# Patient Record
Sex: Female | Born: 1950 | Race: Black or African American | Hispanic: No | Marital: Single | Smoking: Never smoker
Health system: Southern US, Community
[De-identification: ages and names within clinical notes are randomized; demographics above are authoritative.]

## PROBLEM LIST (undated history)

## (undated) DIAGNOSIS — K649 Unspecified hemorrhoids: Secondary | ICD-10-CM

## (undated) DIAGNOSIS — K219 Gastro-esophageal reflux disease without esophagitis: Secondary | ICD-10-CM

## (undated) DIAGNOSIS — I8290 Acute embolism and thrombosis of unspecified vein: Secondary | ICD-10-CM

## (undated) DIAGNOSIS — M1990 Unspecified osteoarthritis, unspecified site: Secondary | ICD-10-CM

## (undated) DIAGNOSIS — I10 Essential (primary) hypertension: Secondary | ICD-10-CM

## (undated) DIAGNOSIS — K279 Peptic ulcer, site unspecified, unspecified as acute or chronic, without hemorrhage or perforation: Secondary | ICD-10-CM

## (undated) DIAGNOSIS — I519 Heart disease, unspecified: Secondary | ICD-10-CM

## (undated) HISTORY — DX: Essential (primary) hypertension: I10

## (undated) HISTORY — PX: OTHER SURGICAL HISTORY: SHX169

## (undated) HISTORY — DX: Unspecified osteoarthritis, unspecified site: M19.90

## (undated) HISTORY — PX: LAPAROSCOPY: SHX197

## (undated) HISTORY — DX: Heart disease, unspecified: I51.9

## (undated) HISTORY — DX: Unspecified hemorrhoids: K64.9

## (undated) HISTORY — DX: Acute embolism and thrombosis of unspecified vein: I82.90

---

## 2004-06-02 ENCOUNTER — Ambulatory Visit: Payer: Self-pay | Admitting: Pain Medicine

## 2004-06-08 ENCOUNTER — Ambulatory Visit: Payer: Self-pay | Admitting: Pain Medicine

## 2004-08-25 ENCOUNTER — Ambulatory Visit: Payer: Self-pay | Admitting: Pain Medicine

## 2004-11-24 ENCOUNTER — Ambulatory Visit: Payer: Self-pay | Admitting: Pain Medicine

## 2005-01-19 ENCOUNTER — Ambulatory Visit: Payer: Self-pay | Admitting: Pain Medicine

## 2005-02-18 ENCOUNTER — Ambulatory Visit: Payer: Self-pay | Admitting: Pain Medicine

## 2005-03-18 ENCOUNTER — Ambulatory Visit: Payer: Self-pay | Admitting: Pain Medicine

## 2005-05-02 ENCOUNTER — Other Ambulatory Visit: Payer: Self-pay

## 2005-05-02 ENCOUNTER — Emergency Department: Payer: Self-pay | Admitting: Emergency Medicine

## 2005-05-04 ENCOUNTER — Ambulatory Visit: Payer: Self-pay | Admitting: Pain Medicine

## 2005-05-27 ENCOUNTER — Ambulatory Visit: Payer: Self-pay | Admitting: Pain Medicine

## 2005-06-11 ENCOUNTER — Emergency Department: Payer: Self-pay | Admitting: Emergency Medicine

## 2005-08-19 ENCOUNTER — Ambulatory Visit: Payer: Self-pay | Admitting: Pain Medicine

## 2005-10-12 ENCOUNTER — Ambulatory Visit: Payer: Self-pay | Admitting: Pain Medicine

## 2006-01-04 ENCOUNTER — Ambulatory Visit: Payer: Self-pay | Admitting: Pain Medicine

## 2006-01-17 ENCOUNTER — Ambulatory Visit: Payer: Self-pay | Admitting: Pain Medicine

## 2006-02-01 ENCOUNTER — Ambulatory Visit: Payer: Self-pay | Admitting: Pain Medicine

## 2006-02-14 ENCOUNTER — Encounter: Payer: Self-pay | Admitting: General Practice

## 2006-03-16 ENCOUNTER — Encounter: Payer: Self-pay | Admitting: General Practice

## 2006-03-29 ENCOUNTER — Ambulatory Visit: Payer: Self-pay | Admitting: Pain Medicine

## 2006-04-16 ENCOUNTER — Encounter: Payer: Self-pay | Admitting: General Practice

## 2006-07-11 ENCOUNTER — Ambulatory Visit: Payer: Self-pay | Admitting: Internal Medicine

## 2007-02-18 ENCOUNTER — Emergency Department: Payer: Self-pay | Admitting: Emergency Medicine

## 2007-06-15 ENCOUNTER — Emergency Department: Payer: Self-pay | Admitting: Internal Medicine

## 2007-09-10 IMAGING — CT CT ABD-PELV W/O CM
1 of 2 series · 16 of 32 positions shown, 20 images · non-contrast
Comparison: none

REASON FOR EXAM: Abdominal pain, stone study
COMMENTS:

[Series 2: stone · axial · 0.90mm/px · z∈[-886,-460]mm · 16 of 156 slices shown, 20 images]
[im 7/156  soft-tissue]
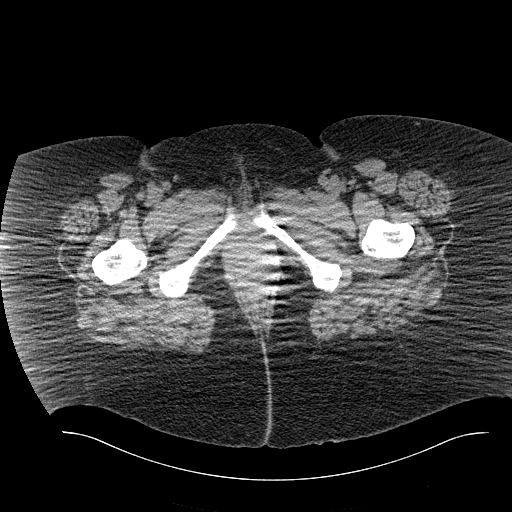
[im 7/156  bone]
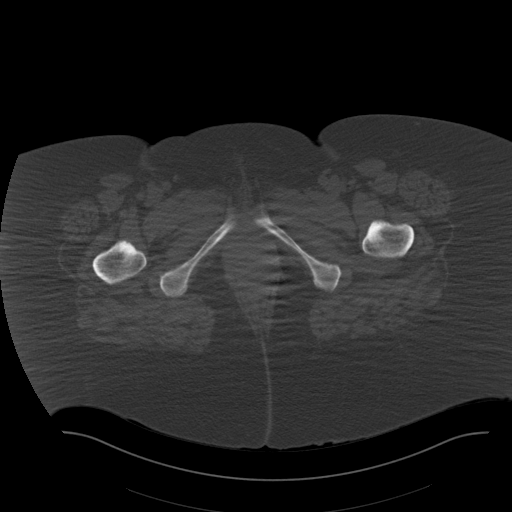
[im 19/156  soft-tissue]
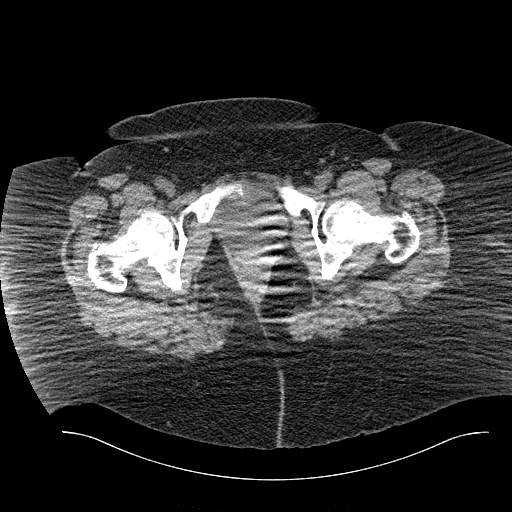
[im 32/156  soft-tissue]
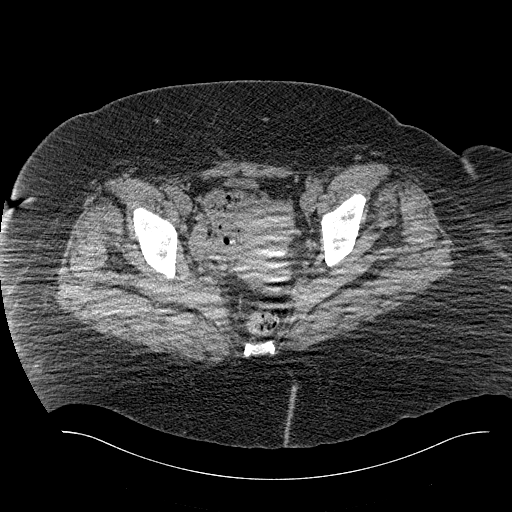
[im 44/156  soft-tissue]
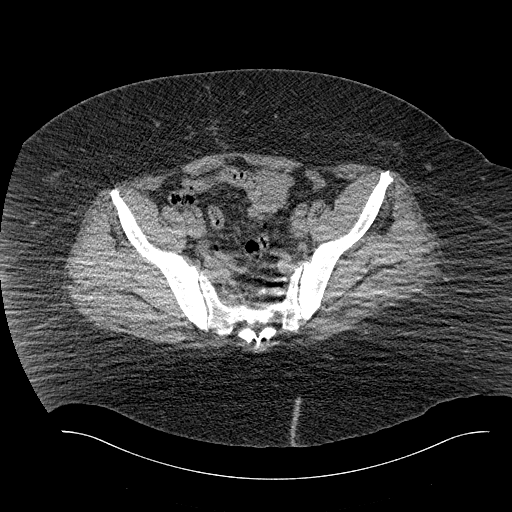
[im 50/156  soft-tissue]
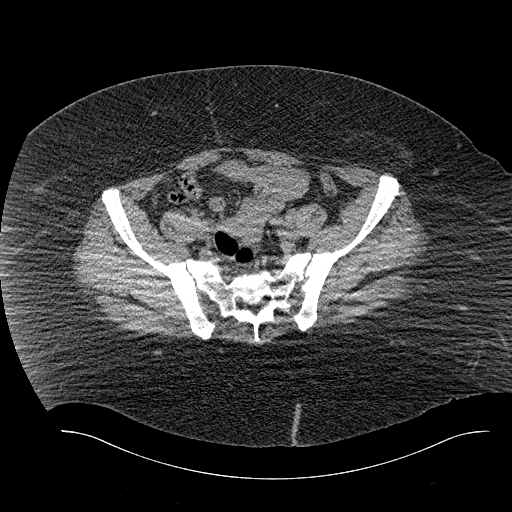
[im 63/156  soft-tissue]
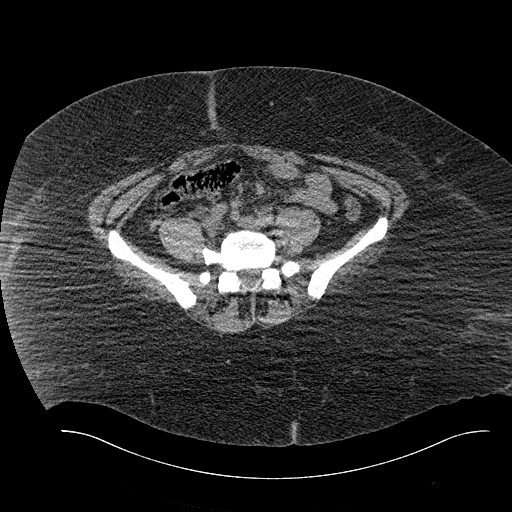
[im 75/156  soft-tissue]
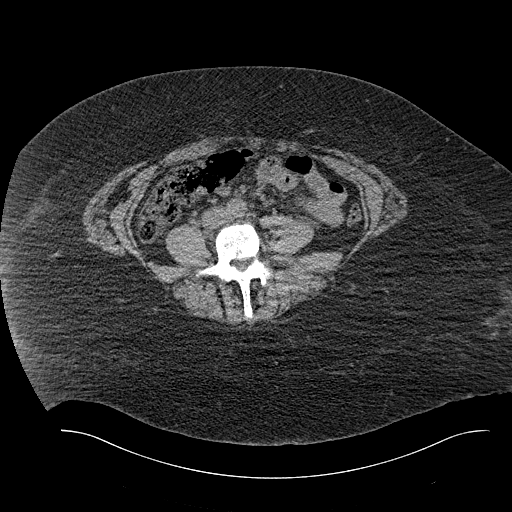
[im 81/156  soft-tissue]
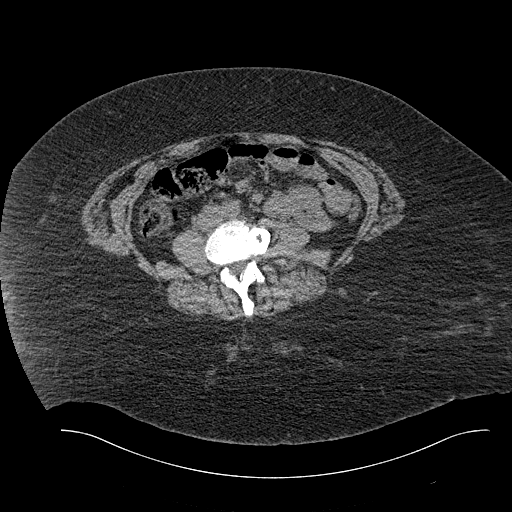
[im 94/156  soft-tissue]
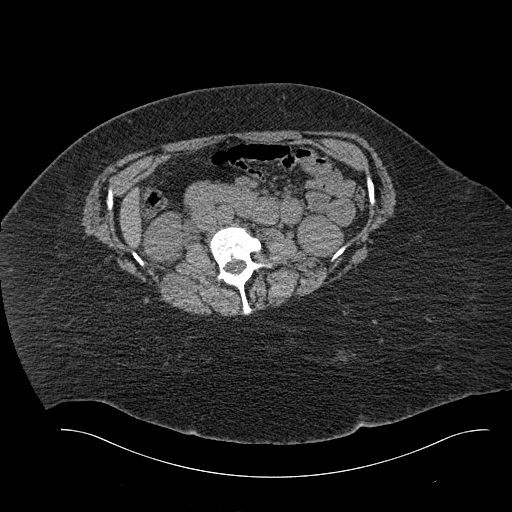
[im 94/156  bone]
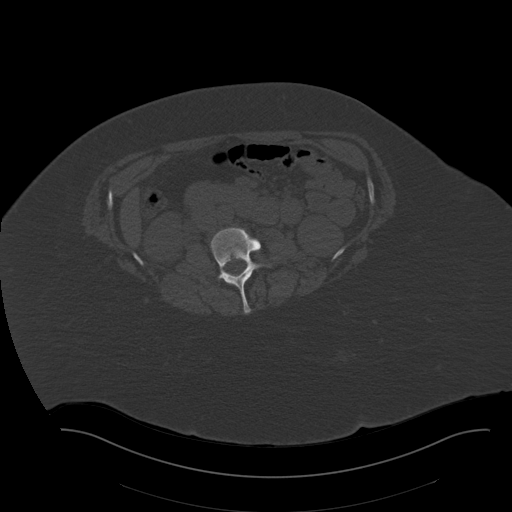
[im 106/156  soft-tissue]
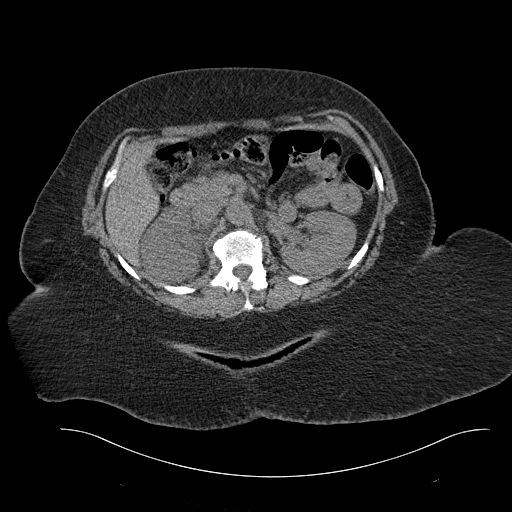
[im 118/156  soft-tissue]
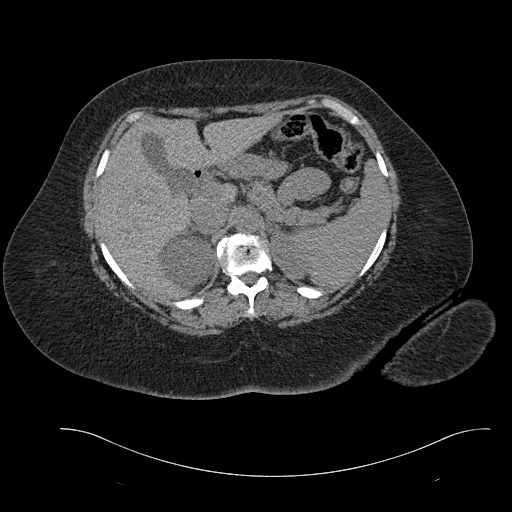
[im 125/156  soft-tissue]
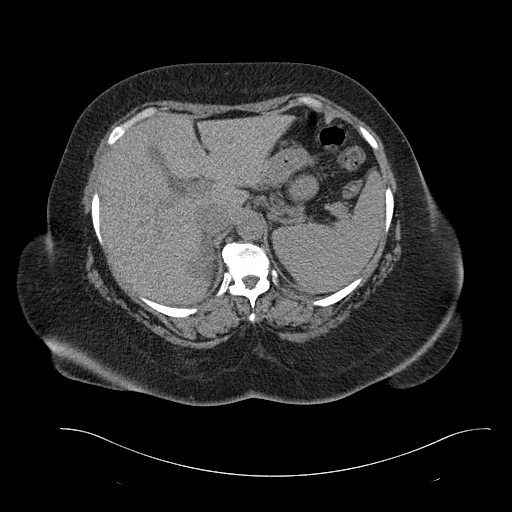
[im 131/156  lung]
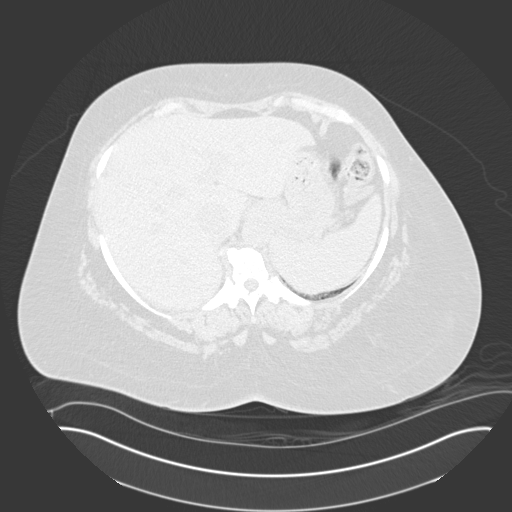
[im 137/156  soft-tissue]
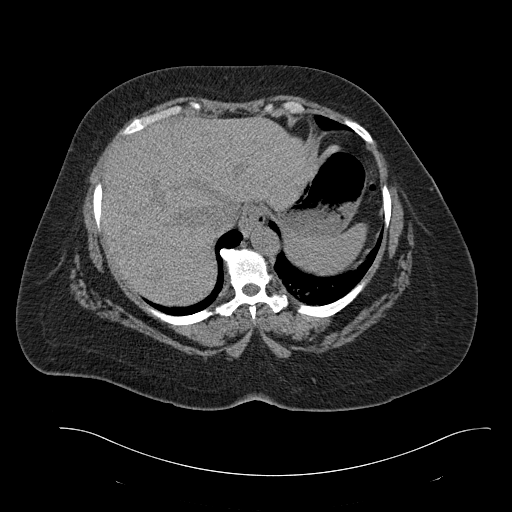
[im 137/156  lung]
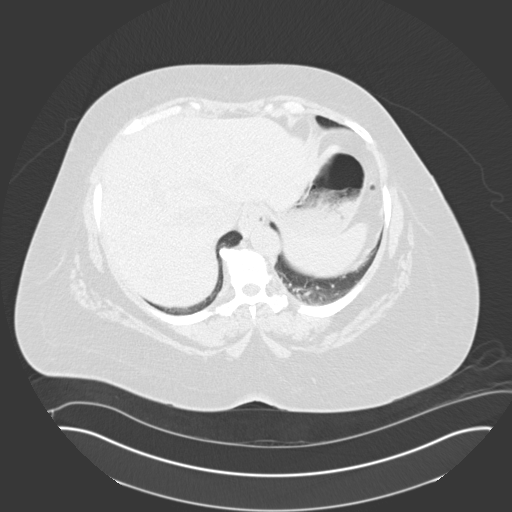
[im 143/156  lung]
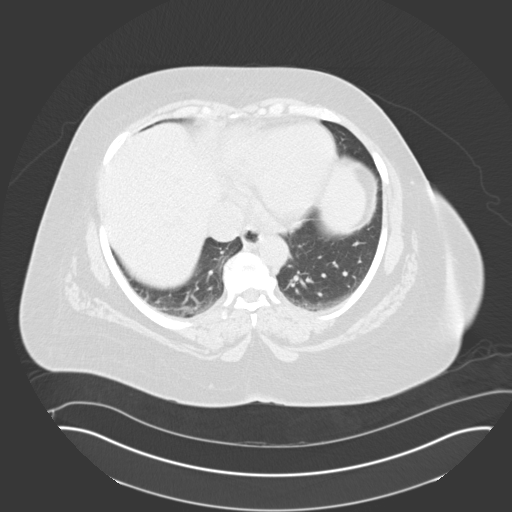
[im 149/156  soft-tissue]
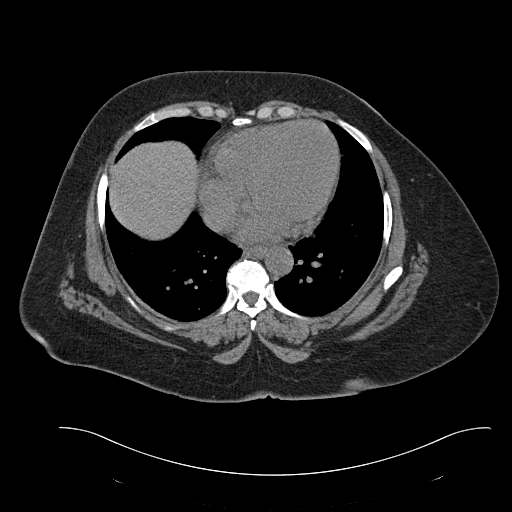
[im 149/156  lung]
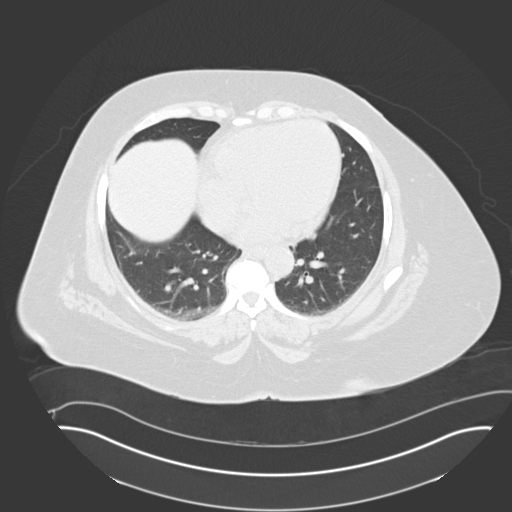

[16 of 32 positions shown; findings below may reference images not displayed]

PROCEDURE:     CT  - CT ABDOMEN AND PELVIS W[DATE]  [DATE]

RESULT:     Standard nonenhanced CT of the abdomen and pelvis is obtained.

The liver and spleen are normal.  The adrenals and pancreas are normal.  No
focal renal cortical abnormalities are identified.  The is no bowel
distention.  A 2-3 mm stone is noted in the region of the RIGHT
ureterovesical junction.  There is no evidence of prominent hydronephrosis.
The bladder is unremarkable.  No inguinal adenopathy is noted.
Calcification of the LEFT psoas is noted.  Although this could represent a
tiny ureteral stone, a calcified lymph node could also present in this
fashion.  There is no LEFT hydronephrosis.
IMPRESSION: Distal RIGHT ureteral 2-3 mm stone.

## 2007-09-10 IMAGING — CR DG CHEST 1V
1 series · 1 of 1 positions shown · non-contrast
Comparison: none

REASON FOR EXAM: Pain
COMMENTS:  LMP: N/A

PROCEDURE:     DXR - DXR CHEST 1 VIEWAP OR PA  - May 02, 2005  [DATE]
RESULT:     No acute cardiopulmonary disease.

[view not recorded]
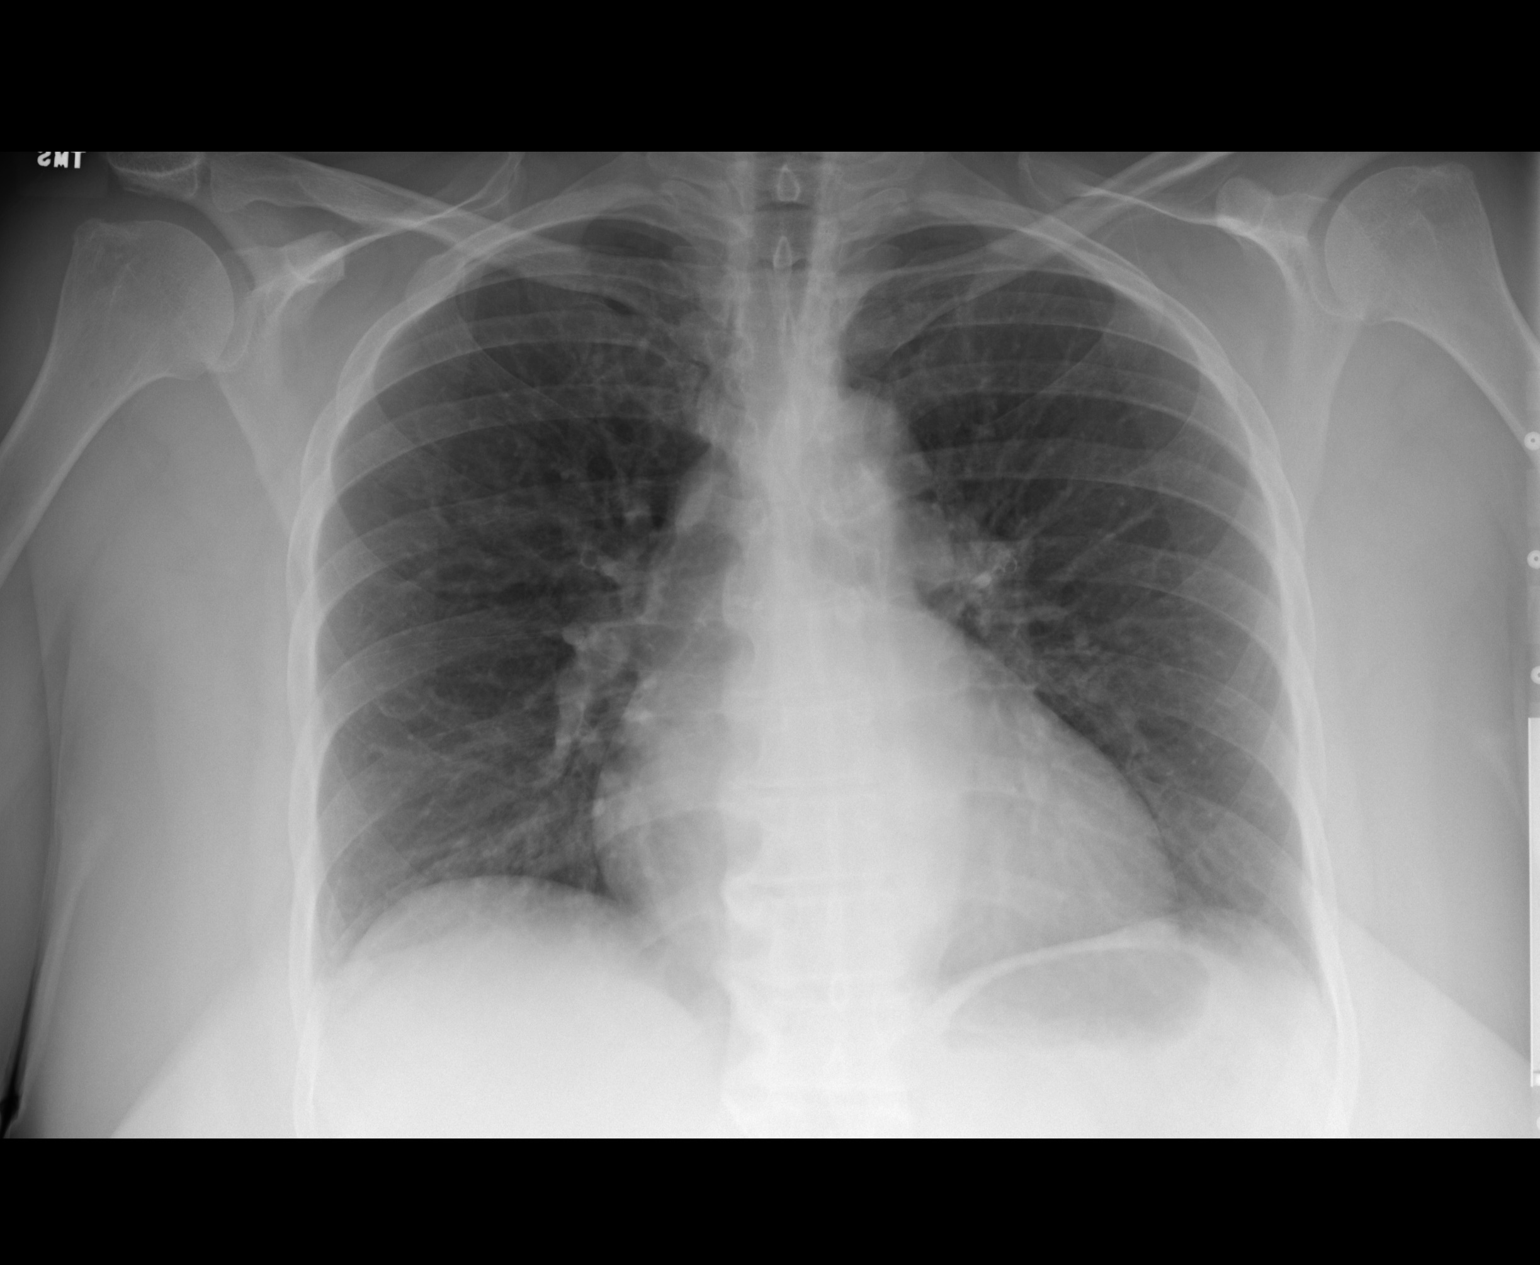

[1 of 1 positions shown; findings below may reference images not displayed]

IMPRESSION: As above.

## 2010-07-16 ENCOUNTER — Ambulatory Visit: Payer: Self-pay

## 2011-06-30 ENCOUNTER — Ambulatory Visit: Payer: Self-pay | Admitting: Internal Medicine

## 2011-08-25 ENCOUNTER — Inpatient Hospital Stay: Payer: Self-pay | Admitting: Internal Medicine

## 2011-08-25 ENCOUNTER — Ambulatory Visit: Payer: Self-pay | Admitting: Internal Medicine

## 2011-08-25 LAB — PROTIME-INR
INR: 1
Prothrombin Time: 14 secs (ref 11.5–14.7)

## 2011-08-25 LAB — CBC
HCT: 30.9 % — ABNORMAL LOW (ref 35.0–47.0)
HGB: 10.1 g/dL — ABNORMAL LOW (ref 12.0–16.0)
MCH: 27.7 pg (ref 26.0–34.0)
MCV: 85 fL (ref 80–100)
Platelet: 203 10*3/uL (ref 150–440)
RDW: 15.7 % — ABNORMAL HIGH (ref 11.5–14.5)

## 2011-08-25 LAB — BASIC METABOLIC PANEL
Calcium, Total: 8.9 mg/dL (ref 8.5–10.1)
Chloride: 107 mmol/L (ref 98–107)
Creatinine: 0.71 mg/dL (ref 0.60–1.30)
EGFR (African American): >60
EGFR (Non-African Amer.): >60
Glucose: 93 mg/dL (ref 65–99)
Potassium: 3.3 mmol/L — ABNORMAL LOW (ref 3.5–5.1)
Sodium: 142 mmol/L (ref 136–145)

## 2011-08-25 LAB — MAGNESIUM: Magnesium: 1.8 mg/dL

## 2011-08-26 LAB — CBC WITH DIFFERENTIAL/PLATELET
Basophil %: 0.8 %
Eosinophil #: 0.4 10*3/uL (ref 0.0–0.7)
Eosinophil %: 7.4 %
HCT: 29.2 % — ABNORMAL LOW (ref 35.0–47.0)
HGB: 9.4 g/dL — ABNORMAL LOW (ref 12.0–16.0)
Lymphocyte #: 1.9 10*3/uL (ref 1.0–3.6)
Lymphocyte %: 34.8 %
MCH: 27.5 pg (ref 26.0–34.0)
MCV: 85 fL (ref 80–100)
Monocyte #: 0.4 10*3/uL (ref 0.0–0.7)
Neutrophil #: 2.7 10*3/uL (ref 1.4–6.5)
Platelet: 202 10*3/uL (ref 150–440)
RBC: 3.42 10*6/uL — ABNORMAL LOW (ref 3.80–5.20)
RDW: 15.9 % — ABNORMAL HIGH (ref 11.5–14.5)
WBC: 5.3 10*3/uL (ref 3.6–11.0)

## 2011-08-26 LAB — BASIC METABOLIC PANEL
Anion Gap: 9 (ref 7–16)
BUN: 14 mg/dL (ref 7–18)
Chloride: 109 mmol/L — ABNORMAL HIGH (ref 98–107)
Co2: 27 mmol/L (ref 21–32)
Osmolality: 288 (ref 275–301)
Potassium: 3.9 mmol/L (ref 3.5–5.1)

## 2011-08-26 LAB — PROTIME-INR
INR: 1.1
Prothrombin Time: 14.3 secs (ref 11.5–14.7)

## 2011-08-26 LAB — CK TOTAL AND CKMB (NOT AT ARMC)
CK, Total: 50 U/L (ref 21–215)
CK, Total: 41 U/L (ref 21–215)
CK-MB: <0.5 ng/mL — ABNORMAL LOW (ref 0.5–3.6)

## 2011-08-26 LAB — TROPONIN I
Troponin-I: <0.02 ng/mL
Troponin-I: <0.02 ng/mL

## 2011-08-27 LAB — PROTIME-INR
INR: 1.2
Prothrombin Time: 15.2 secs — ABNORMAL HIGH (ref 11.5–14.7)

## 2011-08-29 LAB — PROTIME-INR
INR: 1.3
Prothrombin Time: 17 secs — ABNORMAL HIGH (ref 11.5–14.7)

## 2011-08-29 LAB — BASIC METABOLIC PANEL
Anion Gap: 10 (ref 7–16)
BUN: 11 mg/dL (ref 7–18)
Calcium, Total: 9 mg/dL (ref 8.5–10.1)
Chloride: 108 mmol/L — ABNORMAL HIGH (ref 98–107)
Co2: 25 mmol/L (ref 21–32)
Creatinine: 0.61 mg/dL (ref 0.60–1.30)
EGFR (African American): >60
EGFR (Non-African Amer.): >60
Glucose: 93 mg/dL (ref 65–99)
Osmolality: 284 (ref 275–301)
Potassium: 3.9 mmol/L (ref 3.5–5.1)
Sodium: 143 mmol/L (ref 136–145)

## 2011-08-29 LAB — CBC WITH DIFFERENTIAL/PLATELET
Basophil #: 0 10*3/uL (ref 0.0–0.1)
Basophil %: 0.8 %
Eosinophil #: 0.4 10*3/uL (ref 0.0–0.7)
Eosinophil %: 7.8 %
HCT: 34.7 % — ABNORMAL LOW (ref 35.0–47.0)
HGB: 11.2 g/dL — ABNORMAL LOW (ref 12.0–16.0)
Lymphocyte #: 1.4 10*3/uL (ref 1.0–3.6)
Lymphocyte %: 30.6 %
MCH: 27.3 pg (ref 26.0–34.0)
MCHC: 32.2 g/dL (ref 32.0–36.0)
MCV: 85 fL (ref 80–100)
Monocyte #: 0.3 10*3/uL (ref 0.0–0.7)
Monocyte %: 7.6 %
Neutrophil #: 2.4 10*3/uL (ref 1.4–6.5)
Neutrophil %: 53.2 %
Platelet: 228 10*3/uL (ref 150–440)
RBC: 4.09 10*6/uL (ref 3.80–5.20)
RDW: 15.9 % — ABNORMAL HIGH (ref 11.5–14.5)
WBC: 4.6 10*3/uL (ref 3.6–11.0)

## 2011-08-30 LAB — CBC WITH DIFFERENTIAL/PLATELET
Basophil #: 0 10*3/uL (ref 0.0–0.1)
Basophil %: 0.9 %
Eosinophil #: 0.4 10*3/uL (ref 0.0–0.7)
Eosinophil %: 8.7 %
HCT: 34 % — ABNORMAL LOW (ref 35.0–47.0)
HGB: 10.7 g/dL — ABNORMAL LOW (ref 12.0–16.0)
Lymphocyte #: 1.4 10*3/uL (ref 1.0–3.6)
Lymphocyte %: 30.5 %
MCH: 27.1 pg (ref 26.0–34.0)
MCHC: 31.6 g/dL — ABNORMAL LOW (ref 32.0–36.0)
MCV: 86 fL (ref 80–100)
Monocyte #: 0.3 10*3/uL (ref 0.0–0.7)
Monocyte %: 6.7 %
Neutrophil #: 2.5 10*3/uL (ref 1.4–6.5)
Neutrophil %: 53.2 %
Platelet: 242 10*3/uL (ref 150–440)
RBC: 3.97 10*6/uL (ref 3.80–5.20)
RDW: 15.9 % — ABNORMAL HIGH (ref 11.5–14.5)
WBC: 4.7 10*3/uL (ref 3.6–11.0)

## 2011-08-30 LAB — PROTIME-INR
INR: 1.6
Prothrombin Time: 19.4 secs — ABNORMAL HIGH (ref 11.5–14.7)

## 2011-08-30 LAB — BASIC METABOLIC PANEL
Anion Gap: 9 (ref 7–16)
BUN: 9 mg/dL (ref 7–18)
Calcium, Total: 9 mg/dL (ref 8.5–10.1)
Chloride: 107 mmol/L (ref 98–107)
Co2: 23 mmol/L (ref 21–32)
Creatinine: 0.63 mg/dL (ref 0.60–1.30)
EGFR (African American): >60
EGFR (Non-African Amer.): >60
Glucose: 90 mg/dL (ref 65–99)
Osmolality: 276 (ref 275–301)
Potassium: 3.9 mmol/L (ref 3.5–5.1)
Sodium: 139 mmol/L (ref 136–145)

## 2011-08-31 LAB — PROTIME-INR
INR: 1.7
Prothrombin Time: 20.5 secs — ABNORMAL HIGH (ref 11.5–14.7)

## 2011-08-31 LAB — CBC WITH DIFFERENTIAL/PLATELET
Basophil #: 0 10*3/uL (ref 0.0–0.1)
Basophil %: 0.7 %
Eosinophil #: 0.4 10*3/uL (ref 0.0–0.7)
Eosinophil %: 8.5 %
HCT: 33.4 % — ABNORMAL LOW (ref 35.0–47.0)
HGB: 10.7 g/dL — ABNORMAL LOW (ref 12.0–16.0)
Lymphocyte #: 1.4 10*3/uL (ref 1.0–3.6)
Lymphocyte %: 29.3 %
MCH: 27.3 pg (ref 26.0–34.0)
MCHC: 31.9 g/dL — ABNORMAL LOW (ref 32.0–36.0)
MCV: 86 fL (ref 80–100)
Monocyte #: 0.3 10*3/uL (ref 0.0–0.7)
Monocyte %: 6.7 %
Neutrophil #: 2.6 10*3/uL (ref 1.4–6.5)
Neutrophil %: 54.8 %
Platelet: 225 10*3/uL (ref 150–440)
RBC: 3.9 10*6/uL (ref 3.80–5.20)
RDW: 15.9 % — ABNORMAL HIGH (ref 11.5–14.5)
WBC: 4.7 10*3/uL (ref 3.6–11.0)

## 2011-08-31 LAB — BASIC METABOLIC PANEL
Anion Gap: 7 (ref 7–16)
BUN: 10 mg/dL (ref 7–18)
Calcium, Total: 9 mg/dL (ref 8.5–10.1)
Chloride: 107 mmol/L (ref 98–107)
Co2: 25 mmol/L (ref 21–32)
Creatinine: 0.73 mg/dL (ref 0.60–1.30)
EGFR (African American): >60
EGFR (Non-African Amer.): >60
Glucose: 77 mg/dL (ref 65–99)
Osmolality: 275 (ref 275–301)
Potassium: 3.8 mmol/L (ref 3.5–5.1)
Sodium: 139 mmol/L (ref 136–145)

## 2012-01-15 DIAGNOSIS — I8290 Acute embolism and thrombosis of unspecified vein: Secondary | ICD-10-CM

## 2012-01-15 HISTORY — DX: Acute embolism and thrombosis of unspecified vein: I82.90

## 2012-08-11 ENCOUNTER — Ambulatory Visit: Payer: Self-pay | Admitting: Internal Medicine

## 2013-08-01 ENCOUNTER — Encounter: Payer: Self-pay | Admitting: *Deleted

## 2013-09-04 ENCOUNTER — Ambulatory Visit (INDEPENDENT_AMBULATORY_CARE_PROVIDER_SITE_OTHER): Payer: BC Managed Care – PPO | Admitting: General Surgery

## 2013-09-04 ENCOUNTER — Encounter: Payer: Self-pay | Admitting: General Surgery

## 2013-09-04 VITALS — BP 112/78 | HR 72 | Resp 14 | Ht 65.0 in | Wt 368.0 lb

## 2013-09-04 DIAGNOSIS — I890 Lymphedema, not elsewhere classified: Secondary | ICD-10-CM

## 2013-09-04 DIAGNOSIS — I872 Venous insufficiency (chronic) (peripheral): Secondary | ICD-10-CM

## 2013-09-04 NOTE — Patient Instructions (Addendum)
Patient to try compression hose.Return in 2 months. Rx-ankle pressure 30-7440mm hg

## 2013-09-04 NOTE — Progress Notes (Signed)
Patient ID: Paige [name], female   DOB: 05-Oct-1950, 63 y.o.   MRN: [id number]  Chief Complaint  Patient presents with  . Other    chronic lower leg edema    HPI Paige [name] is a 63 y.o. female.  Here today for evaluation of bilateral lower leg edema that is described as chronic. Patient states she has had blood clots in her legs in the past and PE. She is on Coumadin. Leg swelling has been has been present for more than a yr and she feels heaviness, mild discomfort.   HPI  Past Medical History  Diagnosis Date  . Blood clot in vein 01/2012  . Arthritis   . Heart disease   . Hemorrhoids   . Hypertension     History reviewed. No pertinent past surgical history.  History reviewed. No pertinent family history.  Social History History  Substance Use Topics  . Smoking status: Never Smoker   . Smokeless tobacco: Never Used  . Alcohol Use: No    No Known Allergies  Current Outpatient Prescriptions  Medication Sig Dispense Refill  . amLODipine (NORVASC) 5 MG tablet Take 5 mg by mouth daily.      Marland Kitchen aspirin 81 MG tablet Take 81 mg by mouth daily.      . carvedilol (COREG) 25 MG tablet Take 50 mg by mouth 2 (two) times daily with a meal.      . furosemide (LASIX) 20 MG tablet Take 40 mg by mouth daily.      . hydrALAZINE (APRESOLINE) 50 MG tablet Take 50 mg by mouth daily.      Marland Kitchen lisinopril (PRINIVIL,ZESTRIL) 20 MG tablet Take 20 mg by mouth daily.      Marland Kitchen warfarin (COUMADIN) 1 MG tablet Take 9 mg by mouth daily.       No current facility-administered medications for this visit.    Review of Systems Review of Systems  Constitutional: Negative.   Respiratory: Negative.   Cardiovascular: Positive for leg swelling. Negative for chest pain and palpitations.    Blood pressure 112/78, pulse 72, resp. rate 14, height 5\' 5"  (1.651 m), weight 368 lb (166.924 kg).  Physical Exam Physical Exam  Constitutional: She is oriented to person, place, and time. She appears well-developed and  well-nourished.  Pt is obese, has trouble getting up on exam table.  Neck: Neck supple.  Cardiovascular: Normal rate and regular rhythm.   Pulses:      Dorsalis pedis pulses are 0 on the right side, and 0 on the left side.       Posterior tibial pulses are 2+ on the right side, and 2+ on the left side.  Edema in both legs. Combination of edema and lymphedema. DP pulses difficult to feel due to swelling. Feet are warm, cap refill less than 2 secs. Mild skin changes noted in lower half of leg. No visible VV, or spider angiomata.  Neurological: She is alert and oriented to person, place, and time.  Skin: Skin is warm and dry.    Data Reviewed    Assessment      Combination edema and lymphedema both legs. Pt has had DVT and PE and is on coumadin. Her weight is a big factor in her leg swelling.     Plan     Patient to try compression hose.   Circ aid device may be her best option. Explained proper use of compression to pt.          Paige,[name] G  09/04/2013, 8:34 PM

## 2013-09-06 ENCOUNTER — Ambulatory Visit: Payer: Self-pay | Admitting: Family

## 2013-10-16 ENCOUNTER — Encounter: Payer: Self-pay | Admitting: General Surgery

## 2013-10-16 ENCOUNTER — Ambulatory Visit (INDEPENDENT_AMBULATORY_CARE_PROVIDER_SITE_OTHER): Payer: BC Managed Care – PPO | Admitting: General Surgery

## 2013-10-16 VITALS — BP 160/88 | HR 86 | Resp 16 | Ht 65.0 in | Wt 358.0 lb

## 2013-10-16 DIAGNOSIS — I872 Venous insufficiency (chronic) (peripheral): Secondary | ICD-10-CM

## 2013-10-16 DIAGNOSIS — I890 Lymphedema, not elsewhere classified: Secondary | ICD-10-CM

## 2013-10-16 NOTE — Progress Notes (Signed)
Patient ID: Paige [name], female   DOB: March 09, 1951, 63 y.o.   MRN: [id number]  Chief Complaint  Patient presents with  . Varicose Veins    HPI Paige ParisMary [name] is a 63 y.o. female. Here today for 6 weeks bilateral lower leg edema that is described as chronic. Patient states she has had blood clots in her legs in the past and PE. She is on Coumadin. Leg swelling has been has been present for more than a yr and she feels heaviness, mild discomfort. Since her last visit she has been using compression with circ aid and has noted improvement of swelling and symptoms.  HPI  Past Medical History  Diagnosis Date  . Blood clot in vein 01/2012  . Arthritis   . Heart disease   . Hemorrhoids   . Hypertension     History reviewed. No pertinent past surgical history.  History reviewed. No pertinent family history.  Social History History  Substance Use Topics  . Smoking status: Never Smoker   . Smokeless tobacco: Never Used  . Alcohol Use: No    No Known Allergies  Current Outpatient Prescriptions  Medication Sig Dispense Refill  . amLODipine (NORVASC) 5 MG tablet Take 5 mg by mouth daily.      Marland Kitchen. aspirin 81 MG tablet Take 81 mg by mouth daily.      . carvedilol (COREG) 25 MG tablet Take 50 mg by mouth 2 (two) times daily with a meal.      . furosemide (LASIX) 20 MG tablet Take 40 mg by mouth daily.      . hydrALAZINE (APRESOLINE) 50 MG tablet Take 50 mg by mouth daily.      Marland Kitchen. lisinopril (PRINIVIL,ZESTRIL) 20 MG tablet Take 20 mg by mouth daily.      Marland Kitchen. warfarin (COUMADIN) 1 MG tablet Take 9 mg by mouth daily.       No current facility-administered medications for this visit.    Review of Systems Review of Systems  Constitutional: Negative.   Respiratory: Negative.   Cardiovascular: Positive for leg swelling. Negative for chest pain and palpitations.    Blood pressure 160/88, pulse 86, resp. rate 16, height 5\' 5"  (1.651 m), weight 358 lb (162.388 kg).  Physical Exam Physical  ExamThere is rreduction in swelling of legs. No pitting edema noted  But mild lymphedema.  No skin changes seen Feet are warm, color normal.  Data Reviewed   Assessment    Leg edema is  reduced .No skin changes since last visit    Plan    Patient to continue compression hose. Patient to return as needed.        [name],[name] 10/18/2013, 6:08 AM

## 2013-10-16 NOTE — Patient Instructions (Addendum)
Patient to return as needed. Continue use of compression hose.

## 2013-10-18 ENCOUNTER — Encounter: Payer: Self-pay | Admitting: General Surgery

## 2014-06-03 ENCOUNTER — Ambulatory Visit: Payer: Self-pay | Admitting: Specialist

## 2014-06-17 ENCOUNTER — Encounter: Payer: Self-pay | Admitting: General Surgery

## 2014-08-02 ENCOUNTER — Ambulatory Visit: Payer: Self-pay | Admitting: Internal Medicine

## 2014-08-02 LAB — CREATININE, SERUM
Creatinine: 0.74 mg/dL (ref 0.60–1.30)
EGFR (African American): >60
EGFR (Non-African Amer.): >60

## 2014-08-02 LAB — IRON AND TIBC
IRON: 46 ug/dL — AB (ref 50–170)
Iron Bind.Cap.(Total): 294 ug/dL (ref 250–450)
Iron Saturation: 16 %
UNBOUND IRON-BIND. CAP.: 248 ug/dL

## 2014-08-02 LAB — CBC CANCER CENTER
Basophil #: 0 x10 3/mm (ref 0.0–0.1)
Basophil %: 0.7 %
Eosinophil #: 0.2 x10 3/mm (ref 0.0–0.7)
Eosinophil %: 3 %
HCT: 36.5 % (ref 35.0–47.0)
HGB: 11.5 g/dL — ABNORMAL LOW (ref 12.0–16.0)
Lymphocyte #: 1.3 x10 3/mm (ref 1.0–3.6)
Lymphocyte %: 22.3 %
MCH: 26 pg (ref 26.0–34.0)
MCHC: 31.4 g/dL — ABNORMAL LOW (ref 32.0–36.0)
MCV: 83 fL (ref 80–100)
MONOS PCT: 5.6 %
Monocyte #: 0.3 x10 3/mm (ref 0.2–0.9)
Neutrophil #: 3.9 x10 3/mm (ref 1.4–6.5)
Neutrophil %: 68.4 %
Platelet: 269 x10 3/mm (ref 150–440)
RBC: 4.41 10*6/uL (ref 3.80–5.20)
RDW: 17.4 % — AB (ref 11.5–14.5)
WBC: 5.8 x10 3/mm (ref 3.6–11.0)

## 2014-08-02 LAB — FERRITIN: Ferritin (ARMC): 13 ng/mL (ref 8–388)

## 2014-08-02 LAB — RETICULOCYTES
ABSOLUTE RETIC COUNT: 0.045 10*6/uL (ref 0.019–0.186)
Reticulocyte: 1.02 % (ref 0.4–3.1)

## 2014-08-16 ENCOUNTER — Ambulatory Visit: Payer: Self-pay | Admitting: Internal Medicine

## 2014-08-19 LAB — PROTIME-INR
INR: 1.3
PROTHROMBIN TIME: 15.9 s — AB (ref 11.5–14.7)

## 2014-08-20 LAB — PLATELET COUNT: Platelet: 249 10*3/uL (ref 150–440)

## 2014-08-20 LAB — HEPARIN LEVEL (UNFRACTIONATED): Anti-Xa(Unfractionated): 0.2 IU/mL — ABNORMAL LOW (ref 0.30–0.70)

## 2014-08-22 ENCOUNTER — Emergency Department: Payer: Self-pay | Admitting: Emergency Medicine

## 2014-08-22 ENCOUNTER — Ambulatory Visit: Payer: Self-pay | Admitting: Unknown Physician Specialty

## 2014-08-26 LAB — PROTIME-INR
INR: 1.1
Prothrombin Time: 13.9 secs (ref 11.5–14.7)

## 2014-08-26 LAB — CANCER CTR PLATELET CT: Platelet: 218 x10 3/mm (ref 150–440)

## 2014-08-28 LAB — PROTIME-INR
INR: 1.3
PROTHROMBIN TIME: 16 s — AB (ref 11.5–14.7)

## 2014-08-28 LAB — CANCER CTR PLATELET CT: Platelet: 226 x10 3/mm (ref 150–440)

## 2014-08-30 LAB — CANCER CTR PLATELET CT: Platelet: 216 x10 3/mm (ref 150–440)

## 2014-08-30 LAB — PROTIME-INR
INR: 1.4
Prothrombin Time: 17.2 secs — ABNORMAL HIGH (ref 11.5–14.7)

## 2014-09-05 LAB — PROTIME-INR
INR: 2.1
Prothrombin Time: 22.9 secs — ABNORMAL HIGH (ref 11.5–14.7)

## 2014-09-12 LAB — PROTIME-INR
INR: 2.5
Prothrombin Time: 26.2 secs — ABNORMAL HIGH (ref 11.5–14.7)

## 2014-09-16 ENCOUNTER — Ambulatory Visit: Payer: Self-pay | Admitting: Internal Medicine

## 2014-10-15 ENCOUNTER — Ambulatory Visit
Admit: 2014-10-15 | Disposition: A | Discharge status: Home / Self Care | Payer: Self-pay | Attending: Internal Medicine | Admitting: Internal Medicine

## 2014-10-17 ENCOUNTER — Ambulatory Visit: Payer: Self-pay | Admitting: Internal Medicine

## 2014-12-08 NOTE — H&P (Signed)
PATIENT NAME:  Paige [name], Paige [name] MR#:  [id number] DATE OF BIRTH:  09-16-1950  DATE OF ADMISSION:  08/25/2011  REFERRING PHYSICIAN: Dr. Carollee Massed  PRIMARY CARE PHYSICIAN: Dr. Juel Burrow  CHIEF COMPLAINT: Bilateral lower extremity swelling for the last several months with increased shortness of breath.   HISTORY OF PRESENT ILLNESS: This is a 64 year old African American female with history of chronic back pain, hypertension, obesity who presents with lower extremity swelling, which is ongoing for the last several months. She says right leg has been swelling worse and she over the last week developed shortness of breath. She had some mild chest discomfort too over the last couple of days. She denied any nausea, vomiting, diarrhea, fevers, chills, shakes. She had echo Doppler done which showed bilateral DVTs and started on low molecular weight heparin. She subsequently had CT scan of the chest which showed bilateral PE's and we are asked to admit the patient for deep venous thrombosis, PE.   PAST MEDICAL HISTORY:  1. Hypertension.  2. Obesity.  3. Chronic back pain.  MEDICATIONS: Patient states only medication she remembers is Coreg, unknown dose.   PAST SURGICAL HISTORY: None.   ALLERGIES: None.   FAMILY HISTORY: Father died of stomach cancer age 20. Mother died of myocardial infarction age 70.   SOCIAL HISTORY: She is married with one daughter who is healthy. Does not smoke, drink, use illicit drugs. She is originally from [place]. Is a CNA.    REVIEW OF SYSTEMS: CONSTITUTIONAL: No fever bit she has had fatigue. No weakness. EYES: No blurred vision, double vision, pain, redness, inflammation, glaucoma. She does wear glasses. ENT: No tinnitus, ear pain, hearing loss, seasonal allergies, epistaxis, discharge. RESPIRATORY: No cough, wheezing, hemoptysis, asthma, painful respirations. She does have dyspnea. CARDIOVASCULAR: Chest pain but no orthopnea. She has edema on her right leg greater than left.  No arrhythmia. She has dyspnea on exertion. No palpitations. GASTROINTESTINAL: No nausea, vomiting, diarrhea, abdominal pain, hematemesis, melena, gastroesophageal reflux disease. GENITOURINARY: She has some dysuria. No hematuria, renal calculi, increased frequency, incontinence. GYN/BREAST: No breast mass, tenderness, vaginal discharge. She is postmenopausal. ENDOCRINE: No polyuria, nocturia, thyroid problems, increased sweating, heat or cold intolerance. HEME/LYMPH: No anemia, easy bruising, swollen glands. INTEGUMENT: No acne, rash, change in mole, hair, or skin. MUSCULOSKELETAL: She has chronic joint pain in her back. No pain in shoulder, knee, hip, arthritis, swelling, gout. NEUROLOGIC: No numbness, weakness, dysarthria, epilepsy,  tremor, vertigo, ataxia. PSYCH: No anxiety, insomnia, ADD, bipolar, depression.   PHYSICAL EXAMINATION:  VITAL SIGNS: Temperature 97.9, heart rate 97, respirations 16, blood pressure 194/97, sating 95% on room air, that was initially. Her blood pressure now is still elevated at 170.   GENERAL: Patient is well-developed, well-nourished, high BMI. No apparent distress, alert and oriented x3.   HEENT: Pupils equal, reactive to light and accommodation. Extraocular movements intact. Anicteric sclerae. No JVD. No thyromegaly. No lymphadenopathy. No carotid bruits.   LUNGS: Clear to auscultation. No adventitious breath sounds. No increased use of accessory muscles or increased work of breathing.   CARDIOVASCULAR: Regular rate and rhythm. Normal  S1, S2. No murmurs, gallops, rubs appreciated. She has edema bilaterally worse on right than left. 1+ dorsalis pedis pulses.   BREASTS: Deferred.   ABDOMEN: Soft, nontender, nondistended. Positive bowel sounds. No organomegaly. No inguinal hernia.   MUSCULOSKELETAL: Strength 5/5. No clubbing, cyanosis, degenerative joint disease.   SKIN: No rashes, lesions, induration.   LYMPH: No lymphadenopathy in cervical or supraclavicular  area.   NEUROLOGIC: Cranial  nerves II through XII are intact. +2 reflexes.   PSYCH: Alert and oriented x3.   LABORATORY, DIAGNOSTIC AND RADIOLOGICAL DATA: WBC 6.3, hemoglobin 10.1, hematocrit 30.9, platelets 203, MCV 85, glucose 93, BUN 816, creatinine 0.71, sodium 142, potassium 3.3, chloride 107, bicarbonate 27, anion gap 8, INR 1.0. Ultrasound Doppler showed right femoral and popliteal vein deep venous thrombosis, bilateral pulmonary embolus. EKG shows sinus rhythm with PVCs.   ASSESSMENT AND PLAN: This is a 64 year old African American female with obesity, hypertension, chronic back pain who presented with leg swelling. 1. Leg swelling, right deep venous thrombosis, PE. Will continue Lovenox as started by ER. Will start Coumadin. Will get echocardiogram to assess for right heart strain. Will send off hypercoagulable, antiphospholipid and antithrombin three, protein C and S deficiency, homocystine. Should consider IVC filter due to patient's weight. No malignancy seen on CT scan, may need to do other imaging study to assess for malignancy as this could be the cause of her deep venous thrombosis, however, most likely due to her immobility from her obesity.  2. Hypertension. Continue Coreg.  3. Obesity. Counseled about diet. Will check TSH if not already done as outpatient.  4. Hypokalemia. We will check magnesium and replete.  5. Chest pain, shortness of breath. This is most likely due to her PE. Will monitor and cycle cardiac enzymes. Monitor on telemetry and continue aspirin.  6. Anemia. Will monitor. Patient denies any gastrointestinal bleeding or vaginal bleeding.  7. Deep vein thrombosis prophylaxis. Maintain with full dose Lovenox and aspirin and Coumadin.  8. CODE STATUS: FULL CODE.   TOTAL TIME SPENT ON ADMISSION: 50 minutes.       Thank you for allowing me to participate in the care of this patient. Will sign out the patient to Dr. Juel BurrowMasoud.   ____________________________ Corie ChiquitoAmir A.  [place] DragonFirozvi, MD aaf:cms D: 08/25/2011 19:38:39 ET T: 08/26/2011 06:05:27 ET JOB#: [id number]  cc: Karolee OhsAmir A. [place] DragonFirozvi, MD, <Dictator> Corky DownsJaved Masoud, MD Karolee OhsAMIR Laverda PageA Tenisha Fleece MD ELECTRONICALLY SIGNED 08/27/2011 17:16

## 2014-12-08 NOTE — Discharge Summary (Signed)
PATIENT NAME:  Paige [name], [name] MR#:  [id number] DATE OF BIRTH:  1950/10/26  DATE OF ADMISSION:  08/25/2011 DATE OF DISCHARGE:  08/31/2011  HISTORY AND PHYSICAL: Conchita ParisMary Campos is a 64 year old female who is known to have chronic back pain, hypertension, obesity, admitted into the hospital with lower leg swelling. The swelling has been going on for the last several months and was not subsiding, so an ultrasound was obtained which revealed deep venous thrombosis. The patient is known to have hypertension, obesity, and chronic back pain. For the rest of the details of the History and Physical, please see the typed History and Physical sheet.   HOSPITAL COURSE: The patient was admitted into the hospital for deep venous thrombosis. Echocardiogram was done which showed good left ventricular function. No malignancy was seen on the CT scan, but it shows evidence of a pulmonary embolus. The patient's hypokalemia was corrected. The patient was put on a full dose of Lovenox and aspirin and started on Coumadin. Initial lab data revealed WBC count of 6300, hemoglobin 10.1, and hematocrit 30.8. Potassium was 3.3, which was low. EKG showed normal sinus rhythm with occasional PVC. The patient's dose of Coumadin was titrated. Her swelling went down, and she was discharged home. The patient was given a pneumonia vaccine at the time of discharge.   DISCHARGE MEDICATIONS:  1. Carvedilol 6.25 mg p.o. twice a day.  2. Lisinopril 20 mg p.o. once a day.  3. Potassium 1 tablet 10 mEq p.o. daily.  4. Hydrochlorothiazide 25 mg p.o. daily.   DISCHARGE INSTRUCTIONS:  1. She will have a weekly pro time.  2. She should be considered to not be able to work at least for a month because of deep venous thrombosis.   DISCHARGE DIAGNOSIS: Deep venous thrombosis of both legs,  ____________________________ Corky DownsJaved Nesha Counihan, MD jm:cbb D: 09/26/2011 12:25:49 ET T: 09/26/2011 13:49:42 ET JOB#: [id number] Deniro Laymon MD ELECTRONICALLY SIGNED  10/03/2011 18:36

## 2014-12-09 LAB — SURGICAL PATHOLOGY

## 2014-12-30 ENCOUNTER — Encounter: Payer: Self-pay | Admitting: Internal Medicine

## 2014-12-30 ENCOUNTER — Inpatient Hospital Stay: Payer: BLUE CROSS/BLUE SHIELD | Attending: Internal Medicine | Admitting: Internal Medicine

## 2014-12-30 ENCOUNTER — Other Ambulatory Visit: Payer: Self-pay | Admitting: *Deleted

## 2014-12-30 ENCOUNTER — Inpatient Hospital Stay: Payer: BLUE CROSS/BLUE SHIELD

## 2014-12-30 DIAGNOSIS — Z86718 Personal history of other venous thrombosis and embolism: Secondary | ICD-10-CM | POA: Diagnosis not present

## 2014-12-30 DIAGNOSIS — I10 Essential (primary) hypertension: Secondary | ICD-10-CM | POA: Insufficient documentation

## 2014-12-30 DIAGNOSIS — Z803 Family history of malignant neoplasm of breast: Secondary | ICD-10-CM | POA: Insufficient documentation

## 2014-12-30 DIAGNOSIS — I82409 Acute embolism and thrombosis of unspecified deep veins of unspecified lower extremity: Secondary | ICD-10-CM | POA: Insufficient documentation

## 2014-12-30 DIAGNOSIS — Z8041 Family history of malignant neoplasm of ovary: Secondary | ICD-10-CM | POA: Insufficient documentation

## 2014-12-30 DIAGNOSIS — Z86711 Personal history of pulmonary embolism: Secondary | ICD-10-CM | POA: Insufficient documentation

## 2014-12-30 DIAGNOSIS — E668 Other obesity: Secondary | ICD-10-CM | POA: Insufficient documentation

## 2014-12-30 DIAGNOSIS — E669 Obesity, unspecified: Secondary | ICD-10-CM | POA: Insufficient documentation

## 2014-12-30 DIAGNOSIS — Z9229 Personal history of other drug therapy: Secondary | ICD-10-CM | POA: Insufficient documentation

## 2014-12-30 DIAGNOSIS — Z7901 Long term (current) use of anticoagulants: Secondary | ICD-10-CM | POA: Insufficient documentation

## 2014-12-30 DIAGNOSIS — Z79899 Other long term (current) drug therapy: Secondary | ICD-10-CM | POA: Diagnosis not present

## 2014-12-30 DIAGNOSIS — Z7982 Long term (current) use of aspirin: Secondary | ICD-10-CM

## 2014-12-30 DIAGNOSIS — I2699 Other pulmonary embolism without acute cor pulmonale: Secondary | ICD-10-CM | POA: Insufficient documentation

## 2014-12-30 DIAGNOSIS — M1990 Unspecified osteoarthritis, unspecified site: Secondary | ICD-10-CM | POA: Insufficient documentation

## 2014-12-30 LAB — CBC WITH DIFFERENTIAL/PLATELET
Basophils Absolute: 0 10*3/uL (ref 0–0.1)
Basophils Relative: 1 %
EOS ABS: 0.2 10*3/uL (ref 0–0.7)
EOS PCT: 5 %
HCT: 33.1 % — ABNORMAL LOW (ref 35.0–47.0)
Hemoglobin: 10.4 g/dL — ABNORMAL LOW (ref 12.0–16.0)
LYMPHS PCT: 29 %
Lymphs Abs: 1.4 10*3/uL (ref 1.0–3.6)
MCH: 25.5 pg — ABNORMAL LOW (ref 26.0–34.0)
MCHC: 31.4 g/dL — ABNORMAL LOW (ref 32.0–36.0)
MCV: 81.2 fL (ref 80.0–100.0)
Monocytes Absolute: 0.3 10*3/uL (ref 0.2–0.9)
Monocytes Relative: 7 %
NEUTROS ABS: 2.9 10*3/uL (ref 1.4–6.5)
Neutrophils Relative %: 58 %
PLATELETS: 247 10*3/uL (ref 150–440)
RBC: 4.07 MIL/uL (ref 3.80–5.20)
RDW: 17.8 % — ABNORMAL HIGH (ref 11.5–14.5)
WBC: 4.9 10*3/uL (ref 3.6–11.0)

## 2014-12-30 LAB — CREATININE, SERUM
Creatinine, Ser: 0.69 mg/dL (ref 0.44–1.00)
GFR calc Af Amer: >60 mL/min (ref >60)
GFR calc non Af Amer: >60 mL/min (ref >60)

## 2014-12-30 LAB — PROTIME-INR
INR: 1.92
PROTHROMBIN TIME: 22.1 s — AB (ref 11.4–15.0)

## 2014-12-31 ENCOUNTER — Telehealth: Payer: Self-pay | Admitting: *Deleted

## 2014-12-31 DIAGNOSIS — Z86718 Personal history of other venous thrombosis and embolism: Secondary | ICD-10-CM

## 2014-12-31 MED ORDER — ENOXAPARIN SODIUM 120 MG/0.8ML ~~LOC~~ SOLN: 120.0000 mg | Freq: Two times a day (BID) | SUBCUTANEOUS | Status: DC

## 2014-12-31 NOTE — Telephone Encounter (Signed)
Prescription called in to pt's pharmacy.

## 2015-01-07 NOTE — Progress Notes (Signed)
Paige [name] note   Referred by Cletis Athens, MD [address] [place], [place] [address]   This 64 y.o. female patient presents to the clinic for lovenox bridging   Chief Complaint/Problem List: has History of anticoagulant therapy; Arthritis; Deep vein thrombosis of lower extremity; BP (high blood pressure); PE (pulmonary embolism); and Extreme obesity on her problem list.    HPI: no acute complaints. Planning now for f/u egd on 5/25, no bleeding or bruising    Review of Systems:  General: No acute distress, No fatigue, No recent weight loss, fever chills or sweats   HEENT: No headache, or epistaxis   Lungs: No cough, , No wheezing, No chest pain, No, hemoptysis  Cardiac: no chest pain, no palpitations, no orthostasis,     GI: no abdominal pain, nausea, vomiting, diahrrea, or reflux   GU: no dysuria, no hematuria, no vaginal bleeding          Psych: no anxiety, no depression   Allergies Allergies  Allergen Reactions  . Lisinopril Swelling    Significant History/PMH: Past Medical History  Diagnosis Date  . Blood clot in vein 01/2012  . Arthritis   . Heart disease   . Hemorrhoids   . Hypertension    History reviewed. No pertinent past surgical history.          Smoking History: Never smoker,   PFSH: Family History  Sister ovarian cancer, cousin breast cancer  Comments:   Social History:  History  Alcohol Use No    Additional Past Medical and Surgical History:    Home Medications: Prior to Admission medications   Medication Sig Start Date End Date Taking? Authorizing Provider  amLODipine (NORVASC) 5 MG tablet Take 5 mg by mouth daily.   Yes Historical Provider, MD  aspirin 81 MG tablet Take 81 mg by mouth daily.   Yes Historical Provider, MD  carvedilol (COREG) 25 MG tablet Take 50 mg by mouth 2 (two) times daily with a meal.   Yes Historical Provider, MD  furosemide (LASIX) 20 MG tablet Take 40 mg by mouth daily.   Yes Historical Provider, MD   hydrALAZINE (APRESOLINE) 50 MG tablet Take 50 mg by mouth daily.   Yes Historical Provider, MD  lisinopril (PRINIVIL,ZESTRIL) 20 MG tablet Take 20 mg by mouth daily.   Yes Historical Provider, MD  enoxaparin (LOVENOX) 120 MG/0.8ML injection Inject 0.8 mLs (120 mg total) into the skin every 12 (twelve) hours. 01/05/15 01/12/15  Dallas Schimke, MD  omeprazole (PRILOSEC) 40 MG capsule Take 40 mg by mouth daily. 12/09/14   Historical Provider, MD  warfarin (COUMADIN) 3 MG tablet Take 9 mg by mouth daily. 11/30/14   Historical Provider, MD    Vital Signs:  Blood pressure 144/88, pulse 78, temperature 97.8 F (36.6 C), temperature source Tympanic, resp. rate 22, height 5' 5"  (1.651 m), weight 360 lb (163.295 kg).  Physical Exam:  General:  no acute distress  Mental Status: alert and oriented to person, place and time  Head, Ears, Nose,Throat: No thrush  Respiratory: no rales, rhonchi, or wheezing, no dullness  Cardiovascular: regular rate and rhythm  Gastrointestinal: soft, non tender, no masses or organomegaly,obese      Neurological: No gross focal weakness cranial nerves intact  Lymphatics: Not palpable, neck supraclavicular, submandibular, axilla    Psych: Mood, Affect, Unremarkable    Laboratory Results: Office Visit on 12/30/2014  Component Date Value Ref Range Status  . WBC 12/30/2014 4.9  3.6 - 11.0 K/uL  Final  . RBC 12/30/2014 4.07  3.80 - 5.20 MIL/uL Final  . Hemoglobin 12/30/2014 10.4* 12.0 - 16.0 g/dL Final  . HCT 12/30/2014 33.1* 35.0 - 47.0 % Final  . MCV 12/30/2014 81.2  80.0 - 100.0 fL Final  . MCH 12/30/2014 25.5* 26.0 - 34.0 pg Final  . MCHC 12/30/2014 31.4* 32.0 - 36.0 g/dL Final  . RDW 12/30/2014 17.8* 11.5 - 14.5 % Final  . Platelets 12/30/2014 247  150 - 440 K/uL Final  . Neutrophils Relative % 12/30/2014 58   Final  . Neutro Abs 12/30/2014 2.9  1.4 - 6.5 K/uL Final  . Lymphocytes Relative 12/30/2014 29   Final  . Lymphs Abs 12/30/2014 1.4  1.0 - 3.6 K/uL Final   . Monocytes Relative 12/30/2014 7   Final  . Monocytes Absolute 12/30/2014 0.3  0.2 - 0.9 K/uL Final  . Eosinophils Relative 12/30/2014 5   Final  . Eosinophils Absolute 12/30/2014 0.2  0 - 0.7 K/uL Final  . Basophils Relative 12/30/2014 1   Final  . Basophils Absolute 12/30/2014 0.0  0 - 0.1 K/uL Final  . Creatinine, Ser 12/30/2014 0.69  0.44 - 1.00 mg/dL Final  . GFR calc non Af Amer 12/30/2014 >60  >60 mL/min Final  . GFR calc Af Amer 12/30/2014 >60  >60 mL/min Final   Comment: (NOTE) The eGFR has been calculated using the CKD EPI equation. This calculation has not been validated in all clinical situations. eGFR's persistently <60 mL/min signify possible Chronic Kidney Disease.   Marland Kitchen Prothrombin Time 12/30/2014 22.1* 11.4 - 15.0 seconds Final  . INR 12/30/2014 1.92   Final          Radiology Results: No results found.         Assessment and Plan: Impression:  Hx dvt, unprovoked, w/u neg, has high bmi, and at age 40, continuing on anticoagulation. Has pos fh and genetic testing pursuing has hx pigmented lesion have recommended dermatology has ida hx, luminal studies done as noted   Plan:  Stopping coumadin5/19, start lovenox 152m q12h 5/22, last dose 6am  Risk vs benefit, less than full mg/kg dosing is correct dose ... 5/24, resume lovenox and coumdin post procedure, then follow until pt therapeutic, go forward with genetic testing

## 2015-01-08 ENCOUNTER — Ambulatory Visit: Payer: BLUE CROSS/BLUE SHIELD | Admitting: Anesthesiology

## 2015-01-08 ENCOUNTER — Encounter
Admission: RE | Disposition: A | Discharge status: Home / Self Care | Payer: Self-pay | Source: Ambulatory Visit | Attending: Unknown Physician Specialty

## 2015-01-08 ENCOUNTER — Encounter: Payer: Self-pay | Admitting: *Deleted

## 2015-01-08 ENCOUNTER — Ambulatory Visit
Admission: RE | Admit: 2015-01-08 | Discharge: 2015-01-08 | Disposition: A | Discharge status: Home / Self Care | Payer: BLUE CROSS/BLUE SHIELD | Source: Ambulatory Visit | Attending: Unknown Physician Specialty | Admitting: Unknown Physician Specialty

## 2015-01-08 DIAGNOSIS — K259 Gastric ulcer, unspecified as acute or chronic, without hemorrhage or perforation: Secondary | ICD-10-CM | POA: Diagnosis present

## 2015-01-08 DIAGNOSIS — Z7982 Long term (current) use of aspirin: Secondary | ICD-10-CM | POA: Diagnosis not present

## 2015-01-08 DIAGNOSIS — Z86718 Personal history of other venous thrombosis and embolism: Secondary | ICD-10-CM | POA: Diagnosis not present

## 2015-01-08 DIAGNOSIS — M1990 Unspecified osteoarthritis, unspecified site: Secondary | ICD-10-CM | POA: Insufficient documentation

## 2015-01-08 DIAGNOSIS — Z8711 Personal history of peptic ulcer disease: Secondary | ICD-10-CM | POA: Insufficient documentation

## 2015-01-08 DIAGNOSIS — Z79899 Other long term (current) drug therapy: Secondary | ICD-10-CM | POA: Diagnosis not present

## 2015-01-08 DIAGNOSIS — Z7901 Long term (current) use of anticoagulants: Secondary | ICD-10-CM | POA: Insufficient documentation

## 2015-01-08 DIAGNOSIS — K2970 Gastritis, unspecified, without bleeding: Secondary | ICD-10-CM | POA: Diagnosis not present

## 2015-01-08 DIAGNOSIS — I519 Heart disease, unspecified: Secondary | ICD-10-CM | POA: Diagnosis not present

## 2015-01-08 DIAGNOSIS — I10 Essential (primary) hypertension: Secondary | ICD-10-CM | POA: Diagnosis not present

## 2015-01-08 DIAGNOSIS — K649 Unspecified hemorrhoids: Secondary | ICD-10-CM | POA: Diagnosis not present

## 2015-01-08 HISTORY — PX: ESOPHAGOGASTRODUODENOSCOPY: SHX5428

## 2015-01-08 SURGERY — EGD (ESOPHAGOGASTRODUODENOSCOPY)
Anesthesia: General

## 2015-01-08 MED ORDER — SODIUM CHLORIDE 0.9 % IV SOLN: INTRAVENOUS | Status: DC

## 2015-01-08 MED ORDER — LACTATED RINGERS IV SOLN
INTRAVENOUS | Status: DC
  Administered 2015-01-08: 09:00:00 via INTRAVENOUS

## 2015-01-08 MED ORDER — PROPOFOL INFUSION 10 MG/ML OPTIME
INTRAVENOUS | Status: DC | PRN
  Administered 2015-01-08: 40 ug/kg/min via INTRAVENOUS

## 2015-01-08 MED ORDER — MIDAZOLAM HCL 5 MG/5ML IJ SOLN
INTRAMUSCULAR | Status: DC | PRN
Start: 2015-01-08 — End: 2015-01-08
  Administered 2015-01-08: 1 mg via INTRAVENOUS

## 2015-01-08 MED ORDER — GLYCOPYRROLATE 0.2 MG/ML IJ SOLN
INTRAMUSCULAR | Status: DC | PRN
  Administered 2015-01-08: 0.2 mg via INTRAVENOUS

## 2015-01-08 MED ORDER — FENTANYL CITRATE (PF) 100 MCG/2ML IJ SOLN
INTRAMUSCULAR | Status: DC | PRN
  Administered 2015-01-08: 50 ug via INTRAVENOUS

## 2015-01-08 MED ORDER — PROPOFOL 10 MG/ML IV BOLUS
INTRAVENOUS | Status: DC | PRN
  Administered 2015-01-08: 50 mg via INTRAVENOUS

## 2015-01-08 MED ORDER — LIDOCAINE HCL (PF) 2 % IJ SOLN
INTRAMUSCULAR | Status: DC | PRN
Start: 2015-01-08 — End: 2015-01-08
  Administered 2015-01-08: 50 mg

## 2015-01-08 NOTE — Anesthesia Preprocedure Evaluation (Signed)
Anesthesia Evaluation  Patient identified by MRN, date of birth, ID band Patient awake    Reviewed: Allergy & Precautions, NPO status , Patient's Chart, lab work & pertinent test results  History of Anesthesia Complications Negative for: history of anesthetic complications  Airway Mallampati: II       Dental no notable dental hx. (+) Teeth Intact   Pulmonary neg pulmonary ROS,          Cardiovascular Exercise Tolerance: Poor hypertension, Pt. on home beta blockers + Peripheral Vascular Disease Rhythm:Regular Rate:Normal     Neuro/Psych negative neurological ROS  negative psych ROS   GI/Hepatic Neg liver ROS, GERD-  ,  Endo/Other  Morbid obesity  Renal/GU negative Renal ROS  negative genitourinary   Musculoskeletal  (+) Arthritis -, Osteoarthritis,    Abdominal (+) + obese,   Peds negative pediatric ROS (+)  Hematology negative hematology ROS (+)   Anesthesia Other Findings   Reproductive/Obstetrics negative OB ROS                             Anesthesia Physical Anesthesia Plan  ASA: III  Anesthesia Plan: General   Post-op Pain Management:    Induction: Intravenous  Airway Management Planned: Nasal Cannula  Additional Equipment:   Intra-op Plan:   Post-operative Plan:   Informed Consent: I have reviewed the patients History and Physical, chart, labs and discussed the procedure including the risks, benefits and alternatives for the proposed anesthesia with the patient or authorized representative who has indicated his/her understanding and acceptance.     Plan Discussed with: CRNA  Anesthesia Plan Comments:         Anesthesia Quick Evaluation

## 2015-01-08 NOTE — Op Note (Signed)
[facility] Gastroenterology Patient Name: Paige [name] Procedure Date: 01/08/2015 9:07 AM MRN: [id number] Account #: [id number] Date of Birth: Dec 30, 1950 Admit Type: Outpatient Age: 64 Room: [facility] ENDO ROOM 3 Gender: Female Note Status: Finalized Procedure:         Upper GI endoscopy Indications:       Follow-up of acute gastric ulcer Providers:         Scot Jun, MD Referring MD:      [name], MD (Referring MD) Medicines:         Propofol per Anesthesia Complications:     No immediate complications. Procedure:         Pre-Anesthesia Assessment:                    - After reviewing the risks and benefits, the patient was                     deemed in satisfactory condition to undergo the procedure.                    After obtaining informed consent, the endoscope was passed                     under direct vision. Throughout the procedure, the                     patient's blood pressure, pulse, and oxygen saturations                     were monitored continuously. The Endoscope was introduced                     through the mouth, and advanced to the second part of                     duodenum. The upper GI endoscopy was accomplished without                     difficulty. The patient tolerated the procedure well. Findings:      The examined esophagus was normal. GEJ 38cm.      Localized and patchy mild inflammation characterized by erythema was       found in the gastric antrum. The previous ulcer was healed completely.      The gastric body was normal.      The examined duodenum was normal. Impression:        - Normal esophagus.                    - Gastritis.                    - Normal gastric body.                    - Normal examined duodenum.                    - No specimens collected. Recommendation:    - The findings and recommendations were discussed with the                     patient's family. Patient now cleared for bariatric                    surgery from a GI standpoint. Scot Jun,  MD 01/08/2015 9:27:13 AM This report has been signed electronically. Number of Addenda: 0 Note Initiated On: 01/08/2015 9:07 AM      [facility]

## 2015-01-08 NOTE — Transfer of Care (Signed)
Immediate Anesthesia Transfer of Care Note  Patient: Paige [name]  Procedure(s) Performed: Procedure(s): ESOPHAGOGASTRODUODENOSCOPY (EGD) (N/A)  Patient Location: PACU  Anesthesia Type:General  Level of Consciousness: sedated  Airway & Oxygen Therapy: Patient Spontanous Breathing and Patient connected to nasal cannula oxygen  Post-op Assessment: Report given to RN  Post vital signs: Reviewed and stable  Last Vitals:  Filed Vitals:   01/08/15 0835  BP: 140/93  Pulse: 77  Temp: 36.6 C  Resp: 20    Complications: No apparent anesthesia complications

## 2015-01-08 NOTE — H&P (Signed)
   Primary Care Physician:  Corky DownsMASOUD,JAVED, MD Primary Gastroenterologist:  Dr. Mechele CollinElliott  Pre-Procedure History & Physical: HPI:  Jearld PiesMary F Bowler is a 64 y.o. female is here for an endoscopy.   Past Medical History  Diagnosis Date  . Blood clot in vein 01/2012  . Arthritis   . Heart disease   . Hemorrhoids   . Hypertension     History reviewed. No pertinent past surgical history.  Prior to Admission medications   Medication Sig Start Date End Date Taking? Authorizing Provider  amLODipine (NORVASC) 5 MG tablet Take 5 mg by mouth daily.   Yes Historical Provider, MD  carvedilol (COREG) 25 MG tablet Take 50 mg by mouth 2 (two) times daily with a meal.   Yes Historical Provider, MD  lisinopril (PRINIVIL,ZESTRIL) 20 MG tablet Take 20 mg by mouth daily.   Yes Historical Provider, MD  aspirin 81 MG tablet Take 81 mg by mouth daily.    Historical Provider, MD  enoxaparin (LOVENOX) 120 MG/0.8ML injection Inject 0.8 mLs (120 mg total) into the skin every 12 (twelve) hours. 01/05/15 01/12/15  Marin Robertsobert G Gittin, MD  furosemide (LASIX) 20 MG tablet Take 40 mg by mouth daily.    Historical Provider, MD  hydrALAZINE (APRESOLINE) 50 MG tablet Take 50 mg by mouth daily.    Historical Provider, MD  omeprazole (PRILOSEC) 40 MG capsule Take 40 mg by mouth daily. 12/09/14   Historical Provider, MD  warfarin (COUMADIN) 3 MG tablet Take 9 mg by mouth daily. 11/30/14   Historical Provider, MD    Allergies as of 12/26/2014  . (No Known Allergies)    History reviewed. No pertinent family history.  History   Social History  . Marital Status: Single    Spouse Name: N/A  . Number of Children: N/A  . Years of Education: N/A   Occupational History  . Not on file.   Social History Main Topics  . Smoking status: Never Smoker   . Smokeless tobacco: Never Used  . Alcohol Use: No  . Drug Use: No  . Sexual Activity: Not on file   Other Topics Concern  . Not on file   Social History Narrative    Review  of Systems: See HPI, otherwise negative ROS  Physical Exam: BP 140/93 mmHg  Pulse 77  Temp(Src) 97.9 F (36.6 C) (Oral)  Resp 20  Ht 5\' 5"  (1.651 m)  Wt 163.295 kg (360 lb)  BMI 59.91 kg/m2  SpO2 100% General:   Alert,  pleasant and cooperative in NAD Head:  Normocephalic and atraumatic. Neck:  Supple; no masses or thyromegaly. Lungs:  Clear throughout to auscultation. Breath sounds decreased due to obesity   Heart:  Regular rate and rhythm. Abdomen:  Soft, nontender and nondistended. Normal bowel sounds, without guarding, and without rebound.  Abdomen very obese Neurologic:  Alert and  oriented x4;  grossly normal neurologically. Ext: edema of both legs. Impression/Plan: Jearld PiesMary F Vallez is here for an endoscopy to be performed for follow up of gastric ulcer  Risks, benefits, limitations, and alternatives regarding  endoscopy have been reviewed with the patient.  Questions have been answered.  All parties agreeable.   Lynnae PrudeELLIOTT, Sabreena Vogan, MD  01/08/2015, 9:04 AM

## 2015-01-09 ENCOUNTER — Encounter: Payer: Self-pay | Admitting: Unknown Physician Specialty

## 2015-01-17 NOTE — Anesthesia Postprocedure Evaluation (Signed)
  Anesthesia Post-op Note  Patient: Paige PiesMary F [name]  Procedure(s) Performed: Procedure(s): ESOPHAGOGASTRODUODENOSCOPY (EGD) (N/A)  Anesthesia type:General  Patient location: PACU  Post pain: Pain level controlled  Post assessment: Post-op Vital signs reviewed, Patient's Cardiovascular Status Stable, Respiratory Function Stable, Patent Airway and No signs of Nausea or vomiting  Post vital signs: Reviewed and stable  Last Vitals:  Filed Vitals:   01/08/15 1000  BP: 105/81  Pulse: 63  Temp:   Resp: 12    Level of consciousness: awake, alert  and patient cooperative  Complications: No apparent anesthesia complications

## 2015-03-13 ENCOUNTER — Ambulatory Visit: Payer: BLUE CROSS/BLUE SHIELD | Admitting: Hematology and Oncology

## 2015-03-20 ENCOUNTER — Ambulatory Visit: Payer: BLUE CROSS/BLUE SHIELD | Admitting: Family Medicine

## 2015-03-27 ENCOUNTER — Ambulatory Visit: Payer: BLUE CROSS/BLUE SHIELD | Admitting: Family Medicine

## 2015-03-31 ENCOUNTER — Other Ambulatory Visit: Payer: Self-pay | Admitting: Family Medicine

## 2015-03-31 ENCOUNTER — Telehealth: Payer: Self-pay | Admitting: *Deleted

## 2015-03-31 NOTE — Telephone Encounter (Signed)
Was informed by pt that she is now being seen by Dr. Juel Burrow and has an IVC filter. Is also scheduled for bariatric surgery on next Monday. Rhett Bannister, AGNP-C notified.Marland KitchenMarland Kitchen

## 2015-04-13 ENCOUNTER — Inpatient Hospital Stay
Admit: 2015-04-13 | Discharge: 2015-04-13 | Disposition: A | Discharge status: Home / Self Care | Payer: BLUE CROSS/BLUE SHIELD | Attending: Internal Medicine | Admitting: Internal Medicine

## 2015-04-13 ENCOUNTER — Inpatient Hospital Stay: Admit: 2015-04-13 | Payer: BLUE CROSS/BLUE SHIELD

## 2015-04-13 ENCOUNTER — Inpatient Hospital Stay: Payer: BLUE CROSS/BLUE SHIELD

## 2015-04-13 ENCOUNTER — Inpatient Hospital Stay
Admission: EM | Admit: 2015-04-13 | Discharge: 2015-04-18 | DRG: 856 | Disposition: A | Discharge status: Other Acute Inpatient Hospital | Payer: BLUE CROSS/BLUE SHIELD | Attending: Internal Medicine | Admitting: Internal Medicine

## 2015-04-13 ENCOUNTER — Encounter: Payer: Self-pay | Admitting: Internal Medicine

## 2015-04-13 ENCOUNTER — Emergency Department: Payer: BLUE CROSS/BLUE SHIELD

## 2015-04-13 DIAGNOSIS — I468 Cardiac arrest due to other underlying condition: Secondary | ICD-10-CM | POA: Diagnosis present

## 2015-04-13 DIAGNOSIS — R6521 Severe sepsis with septic shock: Secondary | ICD-10-CM | POA: Diagnosis present

## 2015-04-13 DIAGNOSIS — J9601 Acute respiratory failure with hypoxia: Secondary | ICD-10-CM | POA: Diagnosis not present

## 2015-04-13 DIAGNOSIS — G9341 Metabolic encephalopathy: Secondary | ICD-10-CM | POA: Diagnosis present

## 2015-04-13 DIAGNOSIS — Z8249 Family history of ischemic heart disease and other diseases of the circulatory system: Secondary | ICD-10-CM | POA: Diagnosis not present

## 2015-04-13 DIAGNOSIS — I5021 Acute systolic (congestive) heart failure: Secondary | ICD-10-CM | POA: Diagnosis present

## 2015-04-13 DIAGNOSIS — R570 Cardiogenic shock: Secondary | ICD-10-CM | POA: Diagnosis present

## 2015-04-13 DIAGNOSIS — Z7901 Long term (current) use of anticoagulants: Secondary | ICD-10-CM | POA: Diagnosis not present

## 2015-04-13 DIAGNOSIS — Z4659 Encounter for fitting and adjustment of other gastrointestinal appliance and device: Secondary | ICD-10-CM

## 2015-04-13 DIAGNOSIS — E872 Acidosis: Secondary | ICD-10-CM | POA: Diagnosis present

## 2015-04-13 DIAGNOSIS — D696 Thrombocytopenia, unspecified: Secondary | ICD-10-CM | POA: Diagnosis present

## 2015-04-13 DIAGNOSIS — R739 Hyperglycemia, unspecified: Secondary | ICD-10-CM | POA: Diagnosis not present

## 2015-04-13 DIAGNOSIS — N179 Acute kidney failure, unspecified: Secondary | ICD-10-CM | POA: Diagnosis not present

## 2015-04-13 DIAGNOSIS — K2901 Acute gastritis with bleeding: Secondary | ICD-10-CM

## 2015-04-13 DIAGNOSIS — R6520 Severe sepsis without septic shock: Secondary | ICD-10-CM | POA: Diagnosis not present

## 2015-04-13 DIAGNOSIS — A4159 Other Gram-negative sepsis: Secondary | ICD-10-CM | POA: Diagnosis present

## 2015-04-13 DIAGNOSIS — I10 Essential (primary) hypertension: Secondary | ICD-10-CM | POA: Diagnosis present

## 2015-04-13 DIAGNOSIS — I4891 Unspecified atrial fibrillation: Secondary | ICD-10-CM | POA: Diagnosis present

## 2015-04-13 DIAGNOSIS — A419 Sepsis, unspecified organism: Secondary | ICD-10-CM | POA: Diagnosis not present

## 2015-04-13 DIAGNOSIS — T8132XA Disruption of internal operation (surgical) wound, not elsewhere classified, initial encounter: Secondary | ICD-10-CM | POA: Diagnosis present

## 2015-04-13 DIAGNOSIS — Z9884 Bariatric surgery status: Secondary | ICD-10-CM | POA: Diagnosis not present

## 2015-04-13 DIAGNOSIS — E8809 Other disorders of plasma-protein metabolism, not elsewhere classified: Secondary | ICD-10-CM

## 2015-04-13 DIAGNOSIS — E876 Hypokalemia: Secondary | ICD-10-CM | POA: Diagnosis not present

## 2015-04-13 DIAGNOSIS — J9600 Acute respiratory failure, unspecified whether with hypoxia or hypercapnia: Secondary | ICD-10-CM | POA: Diagnosis present

## 2015-04-13 DIAGNOSIS — Z452 Encounter for adjustment and management of vascular access device: Secondary | ICD-10-CM

## 2015-04-13 DIAGNOSIS — I9589 Other hypotension: Secondary | ICD-10-CM | POA: Diagnosis present

## 2015-04-13 DIAGNOSIS — G931 Anoxic brain damage, not elsewhere classified: Secondary | ICD-10-CM | POA: Diagnosis present

## 2015-04-13 DIAGNOSIS — B961 Klebsiella pneumoniae [K. pneumoniae] as the cause of diseases classified elsewhere: Secondary | ICD-10-CM | POA: Diagnosis present

## 2015-04-13 DIAGNOSIS — Z8711 Personal history of peptic ulcer disease: Secondary | ICD-10-CM

## 2015-04-13 DIAGNOSIS — M1990 Unspecified osteoarthritis, unspecified site: Secondary | ICD-10-CM | POA: Diagnosis present

## 2015-04-13 DIAGNOSIS — I469 Cardiac arrest, cause unspecified: Secondary | ICD-10-CM

## 2015-04-13 DIAGNOSIS — D689 Coagulation defect, unspecified: Secondary | ICD-10-CM | POA: Diagnosis present

## 2015-04-13 DIAGNOSIS — E875 Hyperkalemia: Secondary | ICD-10-CM | POA: Diagnosis present

## 2015-04-13 DIAGNOSIS — K651 Peritoneal abscess: Secondary | ICD-10-CM | POA: Diagnosis present

## 2015-04-13 DIAGNOSIS — Z86711 Personal history of pulmonary embolism: Secondary | ICD-10-CM

## 2015-04-13 DIAGNOSIS — R579 Shock, unspecified: Secondary | ICD-10-CM | POA: Diagnosis not present

## 2015-04-13 DIAGNOSIS — Z789 Other specified health status: Secondary | ICD-10-CM

## 2015-04-13 DIAGNOSIS — Z6841 Body Mass Index (BMI) 40.0 and over, adult: Secondary | ICD-10-CM

## 2015-04-13 DIAGNOSIS — Z86718 Personal history of other venous thrombosis and embolism: Secondary | ICD-10-CM | POA: Diagnosis not present

## 2015-04-13 DIAGNOSIS — E870 Hyperosmolality and hypernatremia: Secondary | ICD-10-CM | POA: Diagnosis not present

## 2015-04-13 DIAGNOSIS — D62 Acute posthemorrhagic anemia: Secondary | ICD-10-CM | POA: Diagnosis not present

## 2015-04-13 DIAGNOSIS — T814XXA Infection following a procedure, initial encounter: Principal | ICD-10-CM | POA: Diagnosis present

## 2015-04-13 DIAGNOSIS — N170 Acute kidney failure with tubular necrosis: Secondary | ICD-10-CM | POA: Diagnosis present

## 2015-04-13 DIAGNOSIS — R40244 Other coma, without documented Glasgow coma scale score, or with partial score reported: Secondary | ICD-10-CM | POA: Diagnosis present

## 2015-04-13 DIAGNOSIS — Y832 Surgical operation with anastomosis, bypass or graft as the cause of abnormal reaction of the patient, or of later complication, without mention of misadventure at the time of the procedure: Secondary | ICD-10-CM | POA: Diagnosis present

## 2015-04-13 DIAGNOSIS — T8112XA Postprocedural septic shock, initial encounter: Secondary | ICD-10-CM | POA: Diagnosis present

## 2015-04-13 DIAGNOSIS — G936 Cerebral edema: Secondary | ICD-10-CM | POA: Diagnosis present

## 2015-04-13 DIAGNOSIS — Z9911 Dependence on respirator [ventilator] status: Secondary | ICD-10-CM | POA: Diagnosis not present

## 2015-04-13 DIAGNOSIS — Z515 Encounter for palliative care: Secondary | ICD-10-CM | POA: Diagnosis not present

## 2015-04-13 DIAGNOSIS — K922 Gastrointestinal hemorrhage, unspecified: Secondary | ICD-10-CM | POA: Diagnosis present

## 2015-04-13 DIAGNOSIS — I472 Ventricular tachycardia: Secondary | ICD-10-CM | POA: Diagnosis present

## 2015-04-13 DIAGNOSIS — I97121 Postprocedural cardiac arrest following other surgery: Secondary | ICD-10-CM | POA: Diagnosis not present

## 2015-04-13 DIAGNOSIS — J9690 Respiratory failure, unspecified, unspecified whether with hypoxia or hypercapnia: Secondary | ICD-10-CM

## 2015-04-13 DIAGNOSIS — R4182 Altered mental status, unspecified: Secondary | ICD-10-CM

## 2015-04-13 DIAGNOSIS — K219 Gastro-esophageal reflux disease without esophagitis: Secondary | ICD-10-CM | POA: Diagnosis present

## 2015-04-13 HISTORY — DX: Gastro-esophageal reflux disease without esophagitis: K21.9

## 2015-04-13 HISTORY — DX: Peptic ulcer, site unspecified, unspecified as acute or chronic, without hemorrhage or perforation: K27.9

## 2015-04-13 LAB — BASIC METABOLIC PANEL
ANION GAP: 10 (ref 5–15)
Anion gap: 16 — ABNORMAL HIGH (ref 5–15)
BUN: 78 mg/dL — AB (ref 6–20)
BUN: 75 mg/dL — ABNORMAL HIGH (ref 6–20)
CALCIUM: 7.9 mg/dL — AB (ref 8.9–10.3)
CHLORIDE: 108 mmol/L (ref 101–111)
CO2: 21 mmol/L — ABNORMAL LOW (ref 22–32)
CO2: 21 mmol/L — AB (ref 22–32)
Calcium: 7.5 mg/dL — ABNORMAL LOW (ref 8.9–10.3)
Chloride: 110 mmol/L (ref 101–111)
Creatinine, Ser: 4.62 mg/dL — ABNORMAL HIGH (ref 0.44–1.00)
Creatinine, Ser: 3.48 mg/dL — ABNORMAL HIGH (ref 0.44–1.00)
GFR calc Af Amer: 15 mL/min — ABNORMAL LOW (ref >60)
GFR calc non Af Amer: 13 mL/min — ABNORMAL LOW (ref >60)
GFR, EST AFRICAN AMERICAN: 11 mL/min — AB (ref >60)
GFR, EST NON AFRICAN AMERICAN: 9 mL/min — AB (ref >60)
GLUCOSE: 141 mg/dL — AB (ref 65–99)
Glucose, Bld: 189 mg/dL — ABNORMAL HIGH (ref 65–99)
POTASSIUM: 3.7 mmol/L (ref 3.5–5.1)
Potassium: 4.7 mmol/L (ref 3.5–5.1)
Sodium: 145 mmol/L (ref 135–145)
Sodium: 141 mmol/L (ref 135–145)

## 2015-04-13 LAB — BLOOD GAS, ARTERIAL
ACID-BASE DEFICIT: 8.1 mmol/L — AB (ref 0.0–2.0)
Bicarbonate: 17.8 mEq/L — ABNORMAL LOW (ref 21.0–28.0)
FIO2: 1
MECHANICAL RATE: 16
MECHVT: 450 mL
O2 Saturation: 99.4 %
PEEP: 5 cmH2O
Patient temperature: 37
pCO2 arterial: 37 mmHg (ref 32.0–48.0)
pH, Arterial: 7.29 — ABNORMAL LOW (ref 7.350–7.450)
pO2, Arterial: 174 mmHg — ABNORMAL HIGH (ref 83.0–108.0)

## 2015-04-13 LAB — PH, GASTRIC FLUID (GASTROCCULT CARD): pH, Gastric: 3

## 2015-04-13 LAB — CBC
HCT: 29.3 % — ABNORMAL LOW (ref 35.0–47.0)
HEMATOCRIT: 38.8 % (ref 35.0–47.0)
HEMATOCRIT: 33.6 % — AB (ref 35.0–47.0)
HEMOGLOBIN: 9.1 g/dL — AB (ref 12.0–16.0)
HEMOGLOBIN: 12.5 g/dL (ref 12.0–16.0)
Hemoglobin: 11 g/dL — ABNORMAL LOW (ref 12.0–16.0)
MCH: 26 pg (ref 26.0–34.0)
MCH: 26.4 pg (ref 26.0–34.0)
MCH: 26.5 pg (ref 26.0–34.0)
MCHC: 31.1 g/dL — AB (ref 32.0–36.0)
MCHC: 32.1 g/dL (ref 32.0–36.0)
MCHC: 32.8 g/dL (ref 32.0–36.0)
MCV: 83.6 fL (ref 80.0–100.0)
MCV: 82.1 fL (ref 80.0–100.0)
MCV: 80.8 fL (ref 80.0–100.0)
PLATELETS: 190 10*3/uL (ref 150–440)
Platelets: 148 10*3/uL — ABNORMAL LOW (ref 150–440)
Platelets: 152 10*3/uL (ref 150–440)
RBC: 3.5 MIL/uL — ABNORMAL LOW (ref 3.80–5.20)
RBC: 4.73 MIL/uL (ref 3.80–5.20)
RBC: 4.16 MIL/uL (ref 3.80–5.20)
RDW: 19.4 % — AB (ref 11.5–14.5)
RDW: 18.8 % — ABNORMAL HIGH (ref 11.5–14.5)
RDW: 18.5 % — AB (ref 11.5–14.5)
WBC: 7.8 10*3/uL (ref 3.6–11.0)
WBC: 10.3 10*3/uL (ref 3.6–11.0)
WBC: 11.2 10*3/uL — AB (ref 3.6–11.0)

## 2015-04-13 LAB — COMPREHENSIVE METABOLIC PANEL
ALT: 90 U/L — ABNORMAL HIGH (ref 14–54)
ANION GAP: 10 (ref 5–15)
AST: 179 U/L — ABNORMAL HIGH (ref 15–41)
Albumin: 1.8 g/dL — ABNORMAL LOW (ref 3.5–5.0)
Alkaline Phosphatase: 64 U/L (ref 38–126)
BUN: 72 mg/dL — ABNORMAL HIGH (ref 6–20)
CHLORIDE: 108 mmol/L (ref 101–111)
CO2: 20 mmol/L — AB (ref 22–32)
Calcium: 6.9 mg/dL — ABNORMAL LOW (ref 8.9–10.3)
Creatinine, Ser: 3.68 mg/dL — ABNORMAL HIGH (ref 0.44–1.00)
GFR, EST AFRICAN AMERICAN: 14 mL/min — AB (ref >60)
GFR, EST NON AFRICAN AMERICAN: 12 mL/min — AB (ref >60)
Glucose, Bld: 164 mg/dL — ABNORMAL HIGH (ref 65–99)
POTASSIUM: 4.4 mmol/L (ref 3.5–5.1)
SODIUM: 138 mmol/L (ref 135–145)
Total Bilirubin: 1.8 mg/dL — ABNORMAL HIGH (ref 0.3–1.2)
Total Protein: 5.6 g/dL — ABNORMAL LOW (ref 6.5–8.1)

## 2015-04-13 LAB — URINALYSIS COMPLETE WITH MICROSCOPIC (ARMC ONLY)
Bacteria, UA: NONE SEEN
Bilirubin Urine: NEGATIVE
Glucose, UA: NEGATIVE mg/dL
Ketones, ur: NEGATIVE mg/dL
Leukocytes, UA: NEGATIVE
Nitrite: NEGATIVE
Protein, ur: 100 mg/dL — AB
Specific Gravity, Urine: 1.019 (ref 1.005–1.030)
pH: 5 (ref 5.0–8.0)

## 2015-04-13 LAB — PREPARE RBC (CROSSMATCH)

## 2015-04-13 LAB — GLUCOSE, CAPILLARY
Glucose-Capillary: 141 mg/dL — ABNORMAL HIGH (ref 65–99)
Glucose-Capillary: 115 mg/dL — ABNORMAL HIGH (ref 65–99)
Glucose-Capillary: 115 mg/dL — ABNORMAL HIGH (ref 65–99)

## 2015-04-13 LAB — MAGNESIUM: MAGNESIUM: 2.7 mg/dL — AB (ref 1.7–2.4)

## 2015-04-13 LAB — PROTIME-INR
INR: >12
INR: 10.89
INR: 1.93
PROTHROMBIN TIME: 22.2 s — AB (ref 11.4–15.0)
Prothrombin Time: >120 s — ABNORMAL HIGH (ref 11.4–15.0)
Prothrombin Time: 84.1 seconds — ABNORMAL HIGH (ref 11.4–15.0)

## 2015-04-13 LAB — TROPONIN I
TROPONIN I: 0.03 ng/mL (ref <0.031)
Troponin I: 0.04 ng/mL — ABNORMAL HIGH (ref <0.031)
Troponin I: <0.03 ng/mL

## 2015-04-13 LAB — APTT
APTT: 144 s — AB (ref 24–36)
APTT: 45 s — AB (ref 24–36)
aPTT: 89 seconds — ABNORMAL HIGH (ref 24–36)

## 2015-04-13 LAB — MRSA PCR SCREENING: MRSA by PCR: POSITIVE — AB

## 2015-04-13 LAB — LACTIC ACID, PLASMA
LACTIC ACID, VENOUS: 6.3 mmol/L — AB (ref 0.5–2.0)
Lactic Acid, Venous: 1.7 mmol/L (ref 0.5–2.0)

## 2015-04-13 LAB — ABO/RH: ABO/RH(D): AB POS

## 2015-04-13 MED ORDER — PNEUMOCOCCAL VAC POLYVALENT 25 MCG/0.5ML IJ INJ
0.5000 mL | INJECTION | INTRAMUSCULAR | Status: DC
Start: 2015-04-14 — End: 2015-04-15

## 2015-04-13 MED ORDER — NOREPINEPHRINE 4 MG/250ML-% IV SOLN
INTRAVENOUS | Status: AC
  Administered 2015-04-13: 12:00:00
  Filled 2015-04-13: qty 250

## 2015-04-13 MED ORDER — CHLORHEXIDINE GLUCONATE 0.12% ORAL RINSE (MEDLINE KIT)
15.0000 mL | Freq: Two times a day (BID) | OROMUCOSAL | Status: DC
  Administered 2015-04-13 – 2015-04-18 (×10): 15 mL via OROMUCOSAL
  Filled 2015-04-13 (×12): qty 15

## 2015-04-13 MED ORDER — PIPERACILLIN-TAZOBACTAM 3.375 G IVPB
3.3750 g | Freq: Two times a day (BID) | INTRAVENOUS | Status: DC
  Administered 2015-04-13 (×2): 3.375 g via INTRAVENOUS
  Filled 2015-04-13 (×5): qty 50

## 2015-04-13 MED ORDER — INSULIN ASPART 100 UNIT/ML ~~LOC~~ SOLN
0.0000 [IU] | SUBCUTANEOUS | Status: DC
  Administered 2015-04-13: 4 [IU] via SUBCUTANEOUS
  Administered 2015-04-14 – 2015-04-15 (×5): 3 [IU] via SUBCUTANEOUS
  Filled 2015-04-13: qty 3
  Filled 2015-04-13: qty 4
  Filled 2015-04-13 (×4): qty 3

## 2015-04-13 MED ORDER — DEXTROSE 5 % IV SOLN
INTRAVENOUS | Status: DC
  Administered 2015-04-13 – 2015-04-15 (×3): via INTRAVENOUS

## 2015-04-13 MED ORDER — SODIUM CHLORIDE 0.9 % IV SOLN
INTRAVENOUS | Status: AC | PRN
  Administered 2015-04-13 (×4): 1000 mL via INTRAVENOUS

## 2015-04-13 MED ORDER — SODIUM CHLORIDE 0.9 % IV SOLN
Freq: Once | INTRAVENOUS | Status: AC
  Administered 2015-04-13: 16:00:00 via INTRAVENOUS

## 2015-04-13 MED ORDER — SODIUM BICARBONATE 8.4 % IV SOLN
INTRAVENOUS | Status: AC | PRN
  Administered 2015-04-13: 50 meq via INTRAVENOUS

## 2015-04-13 MED ORDER — SODIUM CHLORIDE 0.9 % IV BOLUS (SEPSIS): 500.0000 mL | INTRAVENOUS | Status: DC | PRN

## 2015-04-13 MED ORDER — ANTISEPTIC ORAL RINSE SOLUTION (CORINZ)
7.0000 mL | Freq: Four times a day (QID) | OROMUCOSAL | Status: DC
  Administered 2015-04-14 – 2015-04-18 (×19): 7 mL via OROMUCOSAL
  Filled 2015-04-13 (×22): qty 7

## 2015-04-13 MED ORDER — FENTANYL CITRATE (PF) 100 MCG/2ML IJ SOLN: 25.0000 ug | INTRAMUSCULAR | Status: DC | PRN

## 2015-04-13 MED ORDER — SODIUM CHLORIDE 0.9 % IV SOLN
10.0000 mL/h | Freq: Once | INTRAVENOUS | Status: AC
  Administered 2015-04-13: 10 mL/h via INTRAVENOUS

## 2015-04-13 MED ORDER — MUPIROCIN 2 % EX OINT
1.0000 "application " | TOPICAL_OINTMENT | Freq: Two times a day (BID) | CUTANEOUS | Status: AC
  Administered 2015-04-13 – 2015-04-18 (×10): 1 via NASAL
  Filled 2015-04-13: qty 22

## 2015-04-13 MED ORDER — NOREPINEPHRINE BITARTRATE 1 MG/ML IV SOLN
0.0000 ug/min | INTRAVENOUS | Status: DC
  Administered 2015-04-13: 40 ug/min via INTRAVENOUS
  Administered 2015-04-13: 30 ug/min via INTRAVENOUS
  Administered 2015-04-14: 20 ug/min via INTRAVENOUS
  Administered 2015-04-14: 30 ug/min via INTRAVENOUS
  Filled 2015-04-13 (×4): qty 16

## 2015-04-13 MED ORDER — VITAMIN K1 10 MG/ML IJ SOLN
10.0000 mg | Freq: Every day | INTRAVENOUS | Status: DC
  Administered 2015-04-14: 10 mg via INTRAVENOUS
  Filled 2015-04-13 (×2): qty 1

## 2015-04-13 MED ORDER — SODIUM CHLORIDE 0.9 % IV SOLN
8.0000 mg/h | INTRAVENOUS | Status: DC
  Administered 2015-04-13 – 2015-04-15 (×4): 8 mg/h via INTRAVENOUS
  Filled 2015-04-13 (×5): qty 80

## 2015-04-13 MED ORDER — SODIUM CHLORIDE 0.9 % IV SOLN
80.0000 mg | Freq: Once | INTRAVENOUS | Status: AC
  Administered 2015-04-13: 80 mg via INTRAVENOUS
  Filled 2015-04-13: qty 80

## 2015-04-13 MED ORDER — DEXTROSE 5 % IV SOLN
150.0000 mg | INTRAVENOUS | Status: AC | PRN
  Administered 2015-04-13: 150 mg via INTRAVENOUS

## 2015-04-13 MED ORDER — AMIODARONE HCL IN DEXTROSE 360-4.14 MG/200ML-% IV SOLN: 30.0000 mg/h | INTRAVENOUS | Status: DC

## 2015-04-13 MED ORDER — SODIUM CHLORIDE 0.9 % IV SOLN
10.0000 mL/h | Freq: Once | INTRAVENOUS | Status: AC
  Administered 2015-04-15: 18:00:00 via INTRAVENOUS

## 2015-04-13 MED ORDER — ATROPINE SULFATE 1 MG/ML IJ SOLN
INTRAMUSCULAR | Status: AC | PRN
Start: 2015-04-13 — End: 2015-04-13
  Administered 2015-04-13: 1 mg via INTRAVENOUS

## 2015-04-13 MED ORDER — INFLUENZA VAC SPLIT QUAD 0.5 ML IM SUSY: 0.5000 mL | PREFILLED_SYRINGE | INTRAMUSCULAR | Status: DC

## 2015-04-13 MED ORDER — LORAZEPAM 2 MG/ML IJ SOLN: 0.5000 mg | INTRAMUSCULAR | Status: DC | PRN

## 2015-04-13 MED ORDER — AMIODARONE HCL IN DEXTROSE 360-4.14 MG/200ML-% IV SOLN
60.0000 mg/h | INTRAVENOUS | Status: AC
  Filled 2015-04-13: qty 200

## 2015-04-13 MED ORDER — VITAMIN K1 10 MG/ML IJ SOLN
10.0000 mg | Freq: Once | INTRAVENOUS | Status: AC
  Administered 2015-04-13: 10 mg via INTRAVENOUS
  Filled 2015-04-13: qty 1

## 2015-04-13 MED ORDER — EPINEPHRINE HCL 0.1 MG/ML IJ SOSY
PREFILLED_SYRINGE | INTRAMUSCULAR | Status: AC | PRN
  Administered 2015-04-13 (×2): 1 mg via INTRAVENOUS

## 2015-04-13 MED ORDER — DOPAMINE-DEXTROSE 3.2-5 MG/ML-% IV SOLN
INTRAVENOUS | Status: AC | PRN
  Administered 2015-04-13: 5 ug/kg/min via INTRAVENOUS

## 2015-04-13 MED ORDER — PANTOPRAZOLE SODIUM 40 MG IV SOLR
40.0000 mg | Freq: Two times a day (BID) | INTRAVENOUS | Status: DC
  Filled 2015-04-13: qty 40

## 2015-04-13 MED ORDER — SODIUM CHLORIDE 0.9 % IJ SOLN
3.0000 mL | Freq: Two times a day (BID) | INTRAMUSCULAR | Status: DC
  Administered 2015-04-14 – 2015-04-18 (×9): 3 mL via INTRAVENOUS

## 2015-04-13 MED ORDER — CHLORHEXIDINE GLUCONATE CLOTH 2 % EX PADS: 6.0000 | MEDICATED_PAD | Freq: Every day | CUTANEOUS | Status: DC

## 2015-04-13 NOTE — Procedures (Signed)
Arterial Catheter Insertion Procedure Note Paige [name] [id number] [birth year]  Procedure: Insertion of Arterial Catheter  Indications: Blood pressure monitoring  Procedure Details Consent: Risks of procedure as well as the alternatives and risks of each were explained to the (patient/caregiver).  Consent for procedure obtained. Time Out: Verified patient identification, verified procedure, site/side was marked, verified correct patient position, special equipment/implants available, medications/allergies/relevent history reviewed, required imaging and test results available.  Performed  Maximum sterile technique was used including antiseptics, cap, gloves, hand hygiene and sheet. Skin prep: Chlorhexidine; local anesthetic administered 20 gauge catheter was inserted into right femoral artery using the Seldinger technique.  Evaluation Blood flow good; BP tracing good. Complications: No apparent complications.   Billy Fischer 04/13/2015

## 2015-04-13 NOTE — ED Provider Notes (Signed)
[facility] Emergency Department Provider Note  ____________________________________________  Time seen: Approximately 610 AM  I have reviewed the triage vital signs and the nursing notes.   HISTORY  Chief Complaint No chief complaint on file.  History Limited by patient cardiac arrest resuscitation  HPI Paige [name] is a 64 y.o. female who comes in this morning with cardiac arrest. According to EMS the patient had a gastric bypass done approximately 7 days ago. The patient had been complaining of some difficulty breathing for the last 2 days,. EMS reports that they were able to get her into the truck and as they were riding to the hospital her eyes rolled into her head and she went into cardiac arrest. The patient had an initial rhythm of sinus tachycardia and then converted to asystole. The patient received 3 doses of epinephrine before arriving to the hospital. She had a king airway in place.    Past Medical History  Diagnosis Date  . Blood clot in vein 01/2012  . Arthritis   . Heart disease   . Hemorrhoids   . Hypertension     Patient Active Problem List   Diagnosis Date Noted  . Cardiac arrest 04/13/2015  . History of anticoagulant therapy 12/30/2014  . Arthritis 12/30/2014  . Deep vein thrombosis of lower extremity 12/30/2014  . BP (high blood pressure) 12/30/2014  . PE (pulmonary embolism) 12/30/2014  . Extreme obesity 12/30/2014    Past Surgical History  Procedure Laterality Date  . Esophagogastroduodenoscopy N/A 01/08/2015    Procedure: ESOPHAGOGASTRODUODENOSCOPY (EGD);  Surgeon: Scot Jun, MD;  Location: [facility] ENDOSCOPY;  Service: Endoscopy;  Laterality: N/A;    Current Outpatient Rx  Name  Route  Sig  Dispense  Refill  . amLODipine (NORVASC) 5 MG tablet   Oral   Take 5 mg by mouth daily.         Marland Kitchen aspirin 81 MG tablet   Oral   Take 81 mg by mouth daily.         . carvedilol (COREG) 25 MG tablet   Oral   Take 50 mg  by mouth 2 (two) times daily with a meal.         . EXPIRED: enoxaparin (LOVENOX) 120 MG/0.8ML injection   Subcutaneous   Inject 0.8 mLs (120 mg total) into the skin every 12 (twelve) hours.   16 Syringe   0   . furosemide (LASIX) 20 MG tablet   Oral   Take 40 mg by mouth daily.         . hydrALAZINE (APRESOLINE) 50 MG tablet   Oral   Take 50 mg by mouth daily.         Marland Kitchen lisinopril (PRINIVIL,ZESTRIL) 20 MG tablet   Oral   Take 20 mg by mouth daily.         Marland Kitchen omeprazole (PRILOSEC) 40 MG capsule   Oral   Take 40 mg by mouth daily.      3   . warfarin (COUMADIN) 3 MG tablet   Oral   Take 9 mg by mouth daily.      3     Allergies Lisinopril  No family history on file.  Social History Social History  Substance Use Topics  . Smoking status: Never Smoker   . Smokeless tobacco: Never Used  . Alcohol Use: No    Review of Systems Unable to assess due to patient unresponsiveness.  ____________________________________________   PHYSICAL EXAM:  VITAL SIGNS: ED  Triage Vitals  Enc Vitals Group     BP 04/13/15 0624 161/149 mmHg     Pulse Rate 04/13/15 0617 63     Resp 04/13/15 0624 14     Temp 04/13/15 0803 95.5 F (35.3 C)     Temp Source 04/13/15 0803 Other     SpO2 04/13/15 0644 100 %     Weight --      Height --      Head Cir --      Peak Flow --      Pain Score --      Pain Loc --      Pain Edu? --      Excl. in GC? --     Constitutional: Unresponsive, ill appearing in severe distress Eyes: Pupils approximately 3 mm and unresponsive Head: Atraumatic. Nose: No congestion/rhinnorhea. Mouth/Throat: Mucous membranes are moist.  Oropharynx non-erythematous. Cardiovascular: Tachycardic Respiratory: Clear to auscultation bilaterally, bilateral chest rise with bag mask ventilation Gastrointestinal: Soft,  No distention. Musculoskeletal: No lower extremity tenderness nor edema.  No joint effusions. Neurologic:  Unresponsive Skin:  Skin is warm,  dry and intact.  Psychiatric: Mood and affect are normal.   ____________________________________________   LABS (all labs ordered are listed, but only abnormal results are displayed)  Labs Reviewed  CBC - Abnormal; Notable for the following:    RBC 3.50 (*)    Hemoglobin 9.1 (*)    HCT 29.3 (*)    MCHC 31.1 (*)    RDW 19.4 (*)    All other components within normal limits  BASIC METABOLIC PANEL - Abnormal; Notable for the following:    CO2 21 (*)    Glucose, Bld 189 (*)    BUN 78 (*)    Creatinine, Ser 4.62 (*)    Calcium 7.9 (*)    GFR calc non Af Amer 9 (*)    GFR calc Af Amer 11 (*)    Anion gap 16 (*)    All other components within normal limits  APTT - Abnormal; Notable for the following:    aPTT 144 (*)    All other components within normal limits  MAGNESIUM - Abnormal; Notable for the following:    Magnesium 2.7 (*)    All other components within normal limits  TROPONIN I - Abnormal; Notable for the following:    Troponin I 0.04 (*)    All other components within normal limits  BLOOD GAS, ARTERIAL - Abnormal; Notable for the following:    pH, Arterial 7.29 (*)    pO2, Arterial 174 (*)    Bicarbonate 17.8 (*)    Acid-base deficit 8.1 (*)    All other components within normal limits  LACTIC ACID, PLASMA - Abnormal; Notable for the following:    Lactic Acid, Venous 6.3 (*)    All other components within normal limits  PH, GASTRIC FLUID (GASTROCCULT CARD)  LACTIC ACID, PLASMA  PROTIME-INR  TYPE AND SCREEN  ABO/RH  PREPARE RBC (CROSSMATCH)  PREPARE FRESH FROZEN PLASMA  PREPARE RBC (CROSSMATCH)  TYPE AND SCREEN   ____________________________________________  EKG  ED ECG REPORT I, Rebecka Apley, the attending physician, personally viewed and interpreted this ECG.   Date: 04/13/2015  EKG Time: 628  Rate: 118  Rhythm: atrial fibrillation, rate 118  Axis: Normal  Intervals:none  ST&T Change: T wave flattening throughout all fields, no ST segment  elevation.  ____________________________________________  RADIOLOGY  Chest x-ray: Endotracheal tube 5.7 cm from the carina, there  is no pneumothorax, pulmonary edema ____________________________________________   PROCEDURES  Procedure(s) performed: Please, see procedure note(s).  INTUBATION Performed by: Lucrezia Europe P  Required items: required blood products, implants, devices, and special equipment available Patient identity confirmed: provided demographic data and hospital-assigned identification number Time out: Immediately prior to procedure a "time out" was called to verify the correct patient, procedure, equipment, support staff and site/side marked as required.  Indications: cardiac arrest  Intubation method: Glidescope Laryngoscopy   Preoxygenation: BVM  Sedatives: none Paralytic: none  Tube Size: 7.5 cuffed  Post-procedure assessment: chest rise and ETCO2 monitor Breath sounds: equal and absent over the epigastrium Tube secured with: ETT holder Chest x-ray interpreted by radiologist and me.  Chest x-ray findings: endotracheal tube 5.7cm above the carina  Patient tolerated the procedure well with no immediate complications.   Critical Care performed: Yes, see critical care note(s)  CRITICAL CARE Performed by: Lucrezia Europe P   Total critical care time:  Critical care time was exclusive of separately billable procedures and treating other patients.  Critical care was necessary to treat or prevent imminent or life-threatening deterioration.  Critical care was time spent personally by me on the following activities: development of treatment plan with patient and/or surrogate as well as nursing, discussions with consultants, evaluation of patient's response to treatment, examination of patient, obtaining history from patient or surrogate, ordering and performing treatments and interventions, ordering and review of laboratory studies, ordering  and review of radiographic studies, pulse oximetry and re-evaluation of patient's condition. care ____________________________________________   INITIAL IMPRESSION / ASSESSMENT AND PLAN / ED COURSE  Pertinent labs & imaging results that were available during my care of the patient were reviewed by me and considered in my medical decision making (see chart for details).  Patient is a 64 year old female who came in with cardiac arrest this evening. The patient has had gastric bypass surgery in the past week. On the patient's arrival to the emergency department we continued chest compressions gave her epinephrine, atropine and sodium bicarbonate, after receiving the medications and a second dose of epinephrine the patient did have return of spontaneous circulation. She was intubated while receiving the medications. The patient had some fluids started and initially had 150 mg of amiodarone given as she was having multiple runs of ventricular tachycardia. The patient was started on an amiodarone drip along with her fluids. She had an NG tube placed which removed approximately 300 mL of black fluid from her stomach. Given the finding the patient was started on pantoprazole and had emergent release blood ordered. The patient's initial blood pressure was in the 160s but is slowly decreased down to the 40s. The patient was started on dopamine and taken off of the amiodarone drip. The patient was also given 4 units of blood, 4 L of normal saline and 4 units of FFP with vitamin K. I discussed the patient's care with Dr. Smitty Cords, her surgeon, the intensivist who did not recommend hypothermic protocol at this time, and the hospitalist who is admitting the patient. I also contacted the GI physician for an endoscopy but he does not feel that the patient's hemoglobin is low enough to have caused her arrest. The patient will be admitted to the intensive care unit for further evaluation and care. After the blood and normal  saline as well as the dopamine the patient's blood pressure did increase to 106/61. The patient will be admitted to the hospitalists service in the intensive care unit. ____________________________________________  FINAL CLINICAL IMPRESSION(S) / ED DIAGNOSES  Final diagnoses:  Gastrointestinal hemorrhage associated with acute gastritis  Cardiac arrest  Acute renal failure, unspecified acute renal failure type      Rebecka Apley, MD 04/13/15 [phone]

## 2015-04-13 NOTE — ED Notes (Signed)
Dr Clent Ridges out to speak with the family and family brought back to be with patient

## 2015-04-13 NOTE — Procedures (Signed)
PROCEDURE NOTE: CVL PLACEMENT  INDICATION:    Monitoring of central venous pressures and/or administration of medications optimally administered in central vein  CONSENT:   Risks of procedure as well as the alternatives were explained to the patient or surrogate. Consent for procedure obtained. A time out was performed.   PROCEDURE  Sterile technique was used including antiseptics, cap, gloves, gown, hand hygiene, mask and full body sheet.  Skin prep: Chlorhexidine; local anesthetic administered  A triple lumen catheter was placed in the R IJ vein using the Seldinger technique.  Ultrasound was used for vessel identification and guidance.   EVALUATION:  Blood flow good  Complications: No apparent complications  Patient tolerated the procedure well.  Chest X-ray revealed proper position and no ptx   Billy Fischer, MD [facility] [phone] Pager [phone]

## 2015-04-13 NOTE — ED Notes (Signed)
Report received from charge rn rachel rn, pt intubated at this time, emergency blood obtained and started, dr Zenda Alpers at bedside, pulses palpated at carotids

## 2015-04-13 NOTE — Progress Notes (Signed)
   04/13/15 0700  Clinical Encounter Type  Visited With Family  Visit Type ED  Referral From Nurse  Consult/Referral To Chaplain  Spiritual Encounters  Spiritual Needs Prayer  Stress Factors  Family Stress Factors Health changes  Chaplain paged to ED and provided comfort and prayer for family.   [employer] [place] Murthy [phone]

## 2015-04-13 NOTE — Progress Notes (Signed)
*  PRELIMINARY RESULTS* Echocardiogram 2D Echocardiogram has been performed.  Paige [name] 04/13/2015, 4:45 PM

## 2015-04-13 NOTE — Consult Note (Signed)
GI Inpatient Consult Note  Reason for Consult:   Anemia, dark material from NG tube   Attending Requesting Consult:   History of Present Illness: Paige [name] is a 64 y.o. female with PMHx PE and DVT, morbid obesity, s/p duod switch and discharged from [facility] on 04/11/15 on lovenox and coumadin,  now admitted after cardiac arrest in the field.  Per the family, she had SOB for 24 hours. Worse today and called EMS.   En route, had cardiac arrest.   In ED, was noted to have some dark material from NG tube and some dark output from JP drain and was given 4 U prbc.  Hgb in ED was 9, with INR of 12.   Admitted to ICU and given FFP,  GI consulted for concern for hemorrhagic shock.    Per the family, she did not have any rectal bleeding, melena, hematemesis at home before EMS called.   Post tranfusion prbc Hgb up to 12.5.    Past Medical History:  Past Medical History  Diagnosis Date  . Blood clot in vein 01/2012  . Arthritis   . Heart disease   . Hemorrhoids   . Hypertension   . GERD (gastroesophageal reflux disease)   . Peptic ulcer disease     Problem List: Patient Active Problem List   Diagnosis Date Noted  . Cardiac arrest 04/13/2015  . Acute renal failure 04/13/2015  . Acute respiratory failure 04/13/2015  . Acute GI bleeding 04/13/2015  . Shock   . History of anticoagulant therapy 12/30/2014  . Arthritis 12/30/2014  . Deep vein thrombosis of lower extremity 12/30/2014  . BP (high blood pressure) 12/30/2014  . PE (pulmonary embolism) 12/30/2014  . Extreme obesity 12/30/2014    Past Surgical History: Past Surgical History  Procedure Laterality Date  . Esophagogastroduodenoscopy N/A 01/08/2015    Procedure: ESOPHAGOGASTRODUODENOSCOPY (EGD);  Surgeon: Scot Jun, MD;  Location: [facility] ENDOSCOPY;  Service: Endoscopy;  Laterality: N/A;    Allergies: Allergies  Allergen Reactions  . Lisinopril Swelling    This medication makes patient lips swell.    Home  Medications: Prescriptions prior to admission  Medication Sig Dispense Refill Last Dose  . amLODipine (NORVASC) 5 MG tablet Take 5 mg by mouth daily.   unknown  . aspirin 81 MG tablet Take 81 mg by mouth 2 (two) times a week.    unknown  . carvedilol (COREG) 25 MG tablet Take 50 mg by mouth 2 (two) times daily with a meal.   unknown  . cholecalciferol (VITAMIN D) 400 UNITS TABS tablet Take 800 Units by mouth every morning.   unknown  . hydrALAZINE (APRESOLINE) 50 MG tablet Take 50 mg by mouth 2 (two) times daily.    unknown  . HYDROcodone-acetaminophen (HYCET) 7.5-325 mg/15 ml solution Take 15 mLs by mouth every 4 (four) hours as needed. For up to 7 days for pain.   unknown  . omeprazole (PRILOSEC) 40 MG capsule Take 40 mg by mouth every morning.   3 unknown  . ondansetron (ZOFRAN ODT) 4 MG disintegrating tablet Take 4 mg by mouth every 4 (four) hours as needed. For up to 7 days   unknown  . warfarin (COUMADIN) 3 MG tablet Take 9 mg by mouth every evening. Take with evening meal.  3 unknown  . enoxaparin (LOVENOX) 120 MG/0.8ML injection Inject 0.8 mLs (120 mg total) into the skin every 12 (twelve) hours. 16 Syringe 0    Home medication reconciliation was completed with  the patient.   Scheduled Inpatient Medications:   . sodium chloride  10 mL/hr Intravenous Once  . [START ON 04/14/2015] antiseptic oral rinse  7 mL Mouth Rinse QID  . chlorhexidine gluconate  15 mL Mouth Rinse BID  . [START ON 04/14/2015] Chlorhexidine Gluconate Cloth  6 each Topical Q0600  . [START ON 04/14/2015] Influenza vac split quadrivalent PF  0.5 mL Intramuscular Tomorrow-1000  . insulin aspart  0-20 Units Subcutaneous 6 times per day  . mupirocin ointment  1 application Nasal BID  . [START ON 04/16/2015] pantoprazole (PROTONIX) IV  40 mg Intravenous Q12H  . [START ON 04/14/2015] phytonadione (VITAMIN K) IV  10 mg Intravenous Daily  . piperacillin-tazobactam (ZOSYN)  IV  3.375 g Intravenous Q12H  . [START ON 04/14/2015]  pneumococcal 23 valent vaccine  0.5 mL Intramuscular Tomorrow-1000  . sodium chloride  3 mL Intravenous Q12H    Continuous Inpatient Infusions:   . amiodarone Stopped (04/13/15 1657)  . dextrose 50 mL/hr at 04/13/15 1344  . norepinephrine (LEVOPHED) Adult infusion 30 mcg/min (04/13/15 1800)  . pantoprozole (PROTONIX) infusion 8 mg/hr (04/13/15 1658)    PRN Inpatient Medications:  fentaNYL (SUBLIMAZE) injection, LORazepam, sodium chloride  Family History: family history includes CAD in her father and mother; Hypertension in her mother.    Social History:   reports that she has never smoked. She has never used smokeless tobacco. She reports that she does not drink alcohol or use illicit drugs.   Review of Systems: Unable to obtain   Physical Examination: BP 124/84 mmHg  Pulse 101  Temp(Src) 98.4 F (36.9 C) (Core (Comment))  Resp 25  Ht 5\' 3"  (1.6 m)  Wt 173 kg (381 lb 6.3 oz)  BMI 67.58 kg/m2  SpO2 97% Gen: intubated and sedated, obese Neck: supple, +JVD, no  thyromegaly Chest: course breath sounds bilat, no w/c.  CV: tachy,irreg,  no m/r/g Abd: soft, NT, ND, +BS in all four quadrants; no HSM, guarding, ridigity, or rebound tenderness, + obese abd.  Ext: no edema, well perfused with 2+ pulses, Skin: no rash or lesions noted Lymph: no LAD appreciated.   Data: Lab Results  Component Value Date   WBC 11.2* 04/13/2015   HGB 11.0* 04/13/2015   HCT 33.6* 04/13/2015   MCV 80.8 04/13/2015   PLT 152 04/13/2015    Recent Labs Lab 04/13/15 0625 04/13/15 1130 04/13/15 1718  HGB 9.1* 12.5 11.0*   Lab Results  Component Value Date   NA 138 04/13/2015   K 4.4 04/13/2015   CL 108 04/13/2015   CO2 20* 04/13/2015   BUN 72* 04/13/2015   CREATININE 3.68* 04/13/2015   Lab Results  Component Value Date   ALT 90* 04/13/2015   AST 179* 04/13/2015   ALKPHOS 64 04/13/2015   BILITOT 1.8* 04/13/2015    Recent Labs Lab 04/13/15 1718  APTT 45*  INR 1.93    Assessment/Plan: Paige [name] is a 64 y.o. female  with PMHx PE and DVT, morbid obesity, s/p duod switch and discharged from [facility] on 04/11/15 on lovenox and coumadin,  now admitted after cardiac arrest in the field after 24 hours of SOB.  Echo shows severely dilated LV, RV, LA, with EF 35%   Did have 300 ml dark material from NG tube which is likely NG trauma and oozing. Also some dark material from JP drain which is likely oozing in setting of INR of 12.   The presenting Hgb of 9,with response to 12.5 after 4U and  no bleeidng except as mentioned above is not at all consistent with hemorrhagic shock.  The arrest in the field is almost certainly a sudden cardiac or pulmonary event  Recommendations: - evaluate and treat for other causes of cardiac arrest than bleeding, cannot be attributed to hemorrhagic shock as no evidence of hemorrhage clinically or on lab testing. (see above).  - would only consider EGD if develops evidence of active bleeding.   Thank you for the consult. Please call with questions or concerns.  Dewell Monnier, Addison Naegeli, MD

## 2015-04-13 NOTE — Consult Note (Signed)
Primary Cardiologist: none   Reason for Consultation : cardiac arrest, atrial fibrillation   HPI : This is a 75 yoF who presented to ER s/p cardiac arrest. She c/o SOB, thus EMS was called, had SVT followed by asystole in ambulance, had runs of VT in ER, was started on amiodarone gtt, had systolic BP in 40s thus amio was held. 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 She is currently in ICU, intubated.      Review of Systems: unable to obtain 2/2 sedation and intubation.    Past Medical History  Diagnosis Date  . Blood clot in vein 01/2012  . Arthritis   . Heart disease   . Hemorrhoids   . Hypertension   . GERD (gastroesophageal reflux disease)   . Peptic ulcer disease     Medications Prior to Admission  Medication Sig Dispense Refill  . amLODipine (NORVASC) 5 MG tablet Take 5 mg by mouth daily.    Marland Kitchen aspirin 81 MG tablet Take 81 mg by mouth 2 (two) times a week.     . carvedilol (COREG) 25 MG tablet Take 50 mg by mouth 2 (two) times daily with a meal.    . cholecalciferol (VITAMIN D) 400 UNITS TABS tablet Take 800 Units by mouth every morning.    . hydrALAZINE (APRESOLINE) 50 MG tablet Take 50 mg by mouth 2 (two) times daily.     Marland Kitchen HYDROcodone-acetaminophen (HYCET) 7.5-325 mg/15 ml solution Take 15 mLs by mouth every 4 (four) hours as needed. For up to 7 days for pain.    Marland Kitchen omeprazole (PRILOSEC) 40 MG capsule Take 40 mg by mouth every morning.   3  . ondansetron (ZOFRAN ODT) 4 MG disintegrating tablet Take 4 mg by mouth every 4 (four) hours as needed. For up to 7 days    . warfarin (COUMADIN) 3 MG tablet Take 9 mg by mouth every evening. Take with evening meal.  3  . enoxaparin (LOVENOX) 120 MG/0.8ML injection Inject 0.8 mLs (120 mg total) into the skin every 12 (twelve) hours. 16 Syringe 0     . sodium chloride  10 mL/hr Intravenous Once  . sodium chloride   10 mL/hr Intravenous Once  . sodium chloride  10 mL/hr Intravenous Once  . sodium chloride   Intravenous Once  . insulin aspart  0-20 Units Subcutaneous 6 times per day  . [START ON 04/16/2015] pantoprazole (PROTONIX) IV  40 mg Intravenous Q12H  . [START ON 04/14/2015] phytonadione (VITAMIN K) IV  10 mg Intravenous Daily    Infusions: . amiodarone    . amiodarone    . dextrose    . norepinephrine (LEVOPHED) Adult infusion    . pantoprozole (PROTONIX) infusion 8 mg/hr (04/13/15 1096)    Allergies  Allergen Reactions  . Lisinopril Swelling    This medication makes patient lips swell.    Social History   Social History  . Marital Status: Single    Spouse Name: N/A  . Number of Children: N/A  . Years of Education: N/A   Occupational History  . Not on file.   Social History Main Topics  . Smoking status: Never Smoker   . Smokeless tobacco: Never Used  . Alcohol Use: No  . Drug Use: No  . Sexual Activity: Not on file   Other Topics Concern  . Not on file  Social History Narrative    Family History  Problem Relation Age of Onset  . CAD Mother   . Hypertension Mother   . CAD Father     PHYSICAL EXAM: Filed Vitals:   04/13/15 0915  BP: 102/69  Pulse:   Temp: 94.6 F (34.8 C)  Resp: 19     Intake/Output Summary (Last 24 hours) at 04/13/15 1144 Last data filed at 04/13/15 0715  Gross per 24 hour  Intake    300 ml  Output    100 ml  Net    200 ml    General:  Morbidly obese, critically ill appering HEENT: normal Neck: supple. no JVD. Carotids 2+ bilat; no bruits. No lymphadenopathy or thryomegaly appreciated. Cor: irregularly, irregular Lungs: rhonchi b/l  Extremities: IO line present in L LE, trace pedal edema b/l Neuro: sedated     Results for orders placed or performed during the hospital encounter of 04/13/15 (from the past 24 hour(s))  CBC     Status: Abnormal   Collection Time: 04/13/15  6:25 AM  Result Value Ref Range   WBC 7.8 3.6 - 11.0  K/uL   RBC 3.50 (L) 3.80 - 5.20 MIL/uL   Hemoglobin 9.1 (L) 12.0 - 16.0 g/dL   HCT 16.1 (L) 09.6 - 04.5 %   MCV 83.6 80.0 - 100.0 fL   MCH 26.0 26.0 - 34.0 pg   MCHC 31.1 (L) 32.0 - 36.0 g/dL   RDW 40.9 (H) 81.1 - 91.4 %   Platelets 190 150 - 440 K/uL  Basic metabolic panel     Status: Abnormal   Collection Time: 04/13/15  6:25 AM  Result Value Ref Range   Sodium 145 135 - 145 mmol/L   Potassium 4.7 3.5 - 5.1 mmol/L   Chloride 108 101 - 111 mmol/L   CO2 21 (L) 22 - 32 mmol/L   Glucose, Bld 189 (H) 65 - 99 mg/dL   BUN 78 (H) 6 - 20 mg/dL   Creatinine, Ser 7.82 (H) 0.44 - 1.00 mg/dL   Calcium 7.9 (L) 8.9 - 10.3 mg/dL   GFR calc non Af Amer 9 (L) >60 mL/min   GFR calc Af Amer 11 (L) >60 mL/min   Anion gap 16 (H) 5 - 15  APTT     Status: Abnormal   Collection Time: 04/13/15  6:25 AM  Result Value Ref Range   aPTT 144 (H) 24 - 36 seconds  Magnesium     Status: Abnormal   Collection Time: 04/13/15  6:25 AM  Result Value Ref Range   Magnesium 2.7 (H) 1.7 - 2.4 mg/dL  Troponin I     Status: Abnormal   Collection Time: 04/13/15  6:25 AM  Result Value Ref Range   Troponin I 0.04 (H) <0.031 ng/mL  Protime-INR     Status: Abnormal   Collection Time: 04/13/15  6:25 AM  Result Value Ref Range   Prothrombin Time >120.0 (H) 11.4 - 15.0 seconds   INR >12.00 (HH)   Blood gas, arterial     Status: Abnormal   Collection Time: 04/13/15  6:39 AM  Result Value Ref Range   FIO2 1.00    Mode ASSIST CONTROL    VT 450 mL   Peep/cpap 5.0 cm H20   pH, Arterial 7.29 (L) 7.350 - 7.450   pCO2 arterial 37 32.0 - 48.0 mmHg   pO2, Arterial 174 (H) 83.0 - 108.0 mmHg   Bicarbonate 17.8 (L) 21.0 - 28.0 mEq/L  Acid-base deficit 8.1 (H) 0.0 - 2.0 mmol/L   O2 Saturation 99.4 %   Patient temperature 37.0    Collection site LEFT RADIAL    Drawn by DT    Sample type ARTERIAL DRAW    Allens test (pass/fail) PASS PASS   Mechanical Rate 16   Lactic acid, plasma     Status: Abnormal   Collection Time:  04/13/15  6:42 AM  Result Value Ref Range   Lactic Acid, Venous 6.3 (HH) 0.5 - 2.0 mmol/L  Type and screen     Status: None (Preliminary result)   Collection Time: 04/13/15  6:42 AM  Result Value Ref Range   ABO/RH(D) AB POS    Antibody Screen NEG    Sample Expiration 04/16/2015    Unit Number [id number]    Blood Component Type RED CELLS,LR    Unit division 00    Status of Unit ISSUED    Transfusion Status OK TO TRANSFUSE    Crossmatch Result Compatible    Unit Number [id number]    Blood Component Type RED CELLS,LR    Unit division 00    Status of Unit ISSUED    Transfusion Status OK TO TRANSFUSE    Crossmatch Result Compatible    Unit Number [id number]    Blood Component Type RBC, LR IRR    Unit division 00    Status of Unit ISSUED    Transfusion Status OK TO TRANSFUSE    Crossmatch Result Compatible    Unit Number [id number]    Blood Component Type RBC, LR IRR    Unit division 00    Status of Unit ISSUED    Transfusion Status OK TO TRANSFUSE    Crossmatch Result Compatible   ABO/Rh     Status: None   Collection Time: 04/13/15  6:42 AM  Result Value Ref Range   ABO/RH(D) AB POS   pH, gastric fluid (gastroccult card)     Status: None   Collection Time: 04/13/15  7:10 AM  Result Value Ref Range   pH, Gastric 3   Prepare RBC     Status: None   Collection Time: 04/13/15  7:51 AM  Result Value Ref Range   Order Confirmation ORDER PROCESSED BY BLOOD BANK   Prepare fresh frozen plasma     Status: None (Preliminary result)   Collection Time: 04/13/15  8:11 AM  Result Value Ref Range   Unit Number [id number]    Blood Component Type THAWED PLASMA    Unit division 00    Status of Unit ISSUED    Transfusion Status OK TO TRANSFUSE    Unit Number [id number]    Blood Component Type THAWED PLASMA    Unit division 00    Status of Unit ALLOCATED    Transfusion Status OK TO TRANSFUSE   Prepare RBC     Status: None   Collection Time: 04/13/15  9:00 AM   Result Value Ref Range   Order Confirmation ORDER PROCESSED BY BLOOD BANK   Glucose, capillary     Status: Abnormal   Collection Time: 04/13/15 10:55 AM  Result Value Ref Range   Glucose-Capillary 141 (H) 65 - 99 mg/dL   Dg Chest 1 View  2/95/1884   CLINICAL DATA:  Line placement  EXAM: CHEST  1 VIEW  COMPARISON:  04/13/2015  FINDINGS: Endotracheal tube has been advanced with the tip now 3 cm above the carina. Right central line is in place with the tip at the cavoatrial junction.  No pneumothorax. There is cardiomegaly with vascular congestion. Low lung volumes with bibasilar opacities, likely atelectasis.  IMPRESSION: Right central line tip at the cavoatrial junction.  No pneumothorax.  Cardiomegaly with vascular congestion.  Low lung volumes with bibasilar atelectasis.   Electronically Signed   By: Charlett Nose M.D.   On: 04/13/2015 11:40   Dg Abd 1 View  04/13/2015   CLINICAL DATA:  Gastric bypass history. Abdominal distention. GI bleed.  EXAM: ABDOMEN - 1 VIEW  COMPARISON:  05/03/2007  FINDINGS: Nonobstructive bowel gas pattern. No organomegaly or suspicious calcification definitively visualized. Study somewhat limited by body habitus and portable nature of the study. No visible free air.  IMPRESSION: No evidence of bowel obstruction or free air.  Limited study.   Electronically Signed   By: Charlett Nose M.D.   On: 04/13/2015 11:38   Dg Chest Portable 1 View  04/13/2015   CLINICAL DATA:  CP are in progress.  EXAM: PORTABLE CHEST - 1 VIEW  COMPARISON:  To over 1920 15  FINDINGS: Endotracheal tube is identified distal tip 5.7 cm from carina. The heart size is enlarged. Mediastinal contour is normal. The aorta is tortuous. There is pulmonary edema. There is no pleural effusion. No acute abnormality is identified in the visualized bones.  IMPRESSION: Endotracheal tube 5.7 cm from carina. There is no pneumothorax. Pulmonary edema.   Electronically Signed   By: Sherian Rein M.D.   On: 04/13/2015  07:18     ASSESSMENT: Cardiac arrest, likely hemorrhagic in nature   PLAN/DISCUSSION: will obtain echocardiogram, cycle cardiac enzymes. Advise restarting amiodarone for HR control as BP is now wnl and VR was 110+ on monitor. Will continue to follow.    Patient and plan discussed with supervising provider, Dr. Adrian Blackwater, who agrees with above findings.   Alinda Sierras Margarito Courser [facility] 04/13/2015 11:44 AM

## 2015-04-13 NOTE — Code Documentation (Signed)
Hold compressions, pulse check

## 2015-04-13 NOTE — Consult Note (Signed)
Date: 04/13/2015                  Patient Name:  Paige [name]  MRN: [id number]  DOB: [birth year]  Age / Sex: 64 y.o., female         PCP: [name], MD                 Service Requesting Consult:  internal medicine                  Reason for Consult:  acute renal failure             History of Present Illness: Patient is a 64 y.o. female with medical problems of morbid obesity, DVT, PE, hypertension, peptic ulcer disease, GERD, who was admitted to [facility] on 04/13/2015 for evaluation of difficulty breathing.   Patient underwent gastric bypass procedure on August 22 at [facility]. Specifically, she underwent LAPAROSCOPIC BILIOPANCREATIC DIVERSION, DUODENAL SWITCH AND HIATAL HERNIA REPAIR (N/A )    She presented to the emergency room via EMS. According to records, EMS reported that they were able to get her into the truck, while riding to the hospital, her eyes rolled into her head and she went into cardiac arrest. Initially sinus tachycardia, then asystole. She is currently in the ICU, intubated. According to "care everywhere" records, her serum creatinine was 0.6 on August 24. No other labs are available until the time of admission. Today, her serum creatinine is noted to be 4.62, BUN of 78. Lactic acid of 6.3. Potassium of 4.7. INR greater than 12 Nephrology team has been requested to evaluate acute renal failure   Medications: Outpatient medications: Prescriptions prior to admission  Medication Sig Dispense Refill Last Dose  . amLODipine (NORVASC) 5 MG tablet Take 5 mg by mouth daily.   unknown  . aspirin 81 MG tablet Take 81 mg by mouth 2 (two) times a week.    unknown  . carvedilol (COREG) 25 MG tablet Take 50 mg by mouth 2 (two) times daily with a meal.   unknown  . cholecalciferol (VITAMIN D) 400 UNITS TABS tablet Take 800 Units by mouth every morning.   unknown  . hydrALAZINE (APRESOLINE) 50 MG tablet Take 50 mg by mouth 2 (two) times daily.    unknown  .  HYDROcodone-acetaminophen (HYCET) 7.5-325 mg/15 ml solution Take 15 mLs by mouth every 4 (four) hours as needed. For up to 7 days for pain.   unknown  . omeprazole (PRILOSEC) 40 MG capsule Take 40 mg by mouth every morning.   3 unknown  . ondansetron (ZOFRAN ODT) 4 MG disintegrating tablet Take 4 mg by mouth every 4 (four) hours as needed. For up to 7 days   unknown  . warfarin (COUMADIN) 3 MG tablet Take 9 mg by mouth every evening. Take with evening meal.  3 unknown  . enoxaparin (LOVENOX) 120 MG/0.8ML injection Inject 0.8 mLs (120 mg total) into the skin every 12 (twelve) hours. 16 Syringe 0     Current medications: Current Facility-Administered Medications  Medication Dose Route Frequency Provider Last Rate Last Dose  . 0.9 %  sodium chloride infusion  10 mL/hr Intravenous Once Rebecka Apley, MD      . 0.9 %  sodium chloride infusion  10 mL/hr Intravenous Once Rebecka Apley, MD      . 0.9 %  sodium chloride infusion  10 mL/hr Intravenous Once Rebecka Apley, MD      . 0.9 %  sodium chloride infusion   Intravenous Once Merwyn Katos, MD      . amiodarone (NEXTERONE PREMIX) 360 MG/200ML (1.8 mg/mL) IV infusion  60 mg/hr Intravenous Continuous Merwyn Katos, MD      . amiodarone (NEXTERONE PREMIX) 360 MG/200ML (1.8 mg/mL) IV infusion  30 mg/hr Intravenous Continuous Merwyn Katos, MD      . dextrose 5 % solution   Intravenous Continuous Merwyn Katos, MD      . fentaNYL (SUBLIMAZE) injection 25-100 mcg  25-100 mcg Intravenous Q2H PRN Merwyn Katos, MD      . insulin aspart (novoLOG) injection 0-20 Units  0-20 Units Subcutaneous 6 times per day Merwyn Katos, MD      . LORazepam (ATIVAN) injection 0.5-1 mg  0.5-1 mg Intravenous Q2H PRN Merwyn Katos, MD      . norepinephrine (LEVOPHED) 16 mg in dextrose 5 % 250 mL (0.064 mg/mL) infusion  0-40 mcg/min Intravenous Continuous Merwyn Katos, MD      . pantoprazole (PROTONIX) 80 mg in sodium chloride 0.9 % 250 mL (0.32  mg/mL) infusion  8 mg/hr Intravenous Continuous Rebecka Apley, MD 25 mL/hr at 04/13/15 0903 8 mg/hr at 04/13/15 0903  . [START ON 04/16/2015] pantoprazole (PROTONIX) injection 40 mg  40 mg Intravenous Q12H Rebecka Apley, MD      . Melene Muller ON 04/14/2015] phytonadione (VITAMIN K) 10 mg in dextrose 5 % 50 mL IVPB  10 mg Intravenous Daily Merwyn Katos, MD      . sodium chloride 0.9 % bolus 500 mL  500 mL Intravenous PRN Merwyn Katos, MD      . sodium chloride 0.9 % injection 3 mL  3 mL Intravenous Q12H Gale Journey, MD          Allergies: Allergies  Allergen Reactions  . Lisinopril Swelling    This medication makes patient lips swell.      Past Medical History: Past Medical History  Diagnosis Date  . Blood clot in vein 01/2012  . Arthritis   . Heart disease   . Hemorrhoids   . Hypertension   . GERD (gastroesophageal reflux disease)   . Peptic ulcer disease      Past Surgical History: Past Surgical History  Procedure Laterality Date  . Esophagogastroduodenoscopy N/A 01/08/2015    Procedure: ESOPHAGOGASTRODUODENOSCOPY (EGD);  Surgeon: Scot Jun, MD;  Location[facility] ENDOSCOPY;  Service: Endoscopy;  Laterality: N/A;     Family History: Family History  Problem Relation Age of Onset  . CAD Mother   . Hypertension Mother   . CAD Father      Social History: Social History   Social History  . Marital Status: Single    Spouse Name: N/A  . Number of Children: N/A  . Years of Education: N/A   Occupational History  . Not on file.   Social History Main Topics  . Smoking status: Never Smoker   . Smokeless tobacco: Never Used  . Alcohol Use: No  . Drug Use: No  . Sexual Activity: Not on file   Other Topics Concern  . Not on file   Social History Narrative     Review of Systems:  Unavailable as patient is intubated, sedated and critically ill  Vital Signs: Blood pressure 90/56, pulse 27, temperature 95 F (35 C), temperature source Core  (Comment), resp. rate 18, SpO2 96 %.   Intake/Output Summary (Last 24 hours) at 04/13/15 1207 Last data  filed at 04/13/15 1130  Gross per 24 hour  Intake    300 ml  Output    100 ml  Net    200 ml    Weight trends: There were no vitals filed for this visit.  Physical Exam: General:  critically ill-appearing, morbidly obese   HEENT  ET tube in place   Neck:  supple   Lungs:  ventilator assisted   Heart::  regular rhythm, tachycardia   Abdomen:  soft, obese, surgical drain in place   Extremities:  2-3+ dependent peripheral edema   Neurologic:  sedated   Skin:  no acute rashes   Access:   Foley:  present        Lab results: Basic Metabolic Panel:  Recent Labs Lab 04/13/15 0625  NA 145  K 4.7  CL 108  CO2 21*  GLUCOSE 189*  BUN 78*  CREATININE 4.62*  CALCIUM 7.9*  MG 2.7*    Liver Function Tests: No results for input(s): AST, ALT, ALKPHOS, BILITOT, PROT, ALBUMIN in the last 168 hours. No results for input(s): LIPASE, AMYLASE in the last 168 hours. No results for input(s): AMMONIA in the last 168 hours.  CBC:  Recent Labs Lab 04/13/15 0625  WBC 7.8  HGB 9.1*  HCT 29.3*  MCV 83.6  PLT 190    Cardiac Enzymes:  Recent Labs Lab 04/13/15 0625  TROPONINI 0.04*    BNP: Invalid input(s): POCBNP  CBG:  Recent Labs Lab 04/13/15 1055  GLUCAP 141*    Microbiology: No results found for this or any previous visit (from the past 720 hour(s)).   Coagulation Studies:  Recent Labs  04/13/15 0625  LABPROT >120.0*  INR >12.00*    Urinalysis: No results for input(s): COLORURINE, LABSPEC, PHURINE, GLUCOSEU, HGBUR, BILIRUBINUR, KETONESUR, PROTEINUR, UROBILINOGEN, NITRITE, LEUKOCYTESUR in the last 72 hours.  Invalid input(s): APPERANCEUR    Imaging: Dg Chest 1 View  04/13/2015   CLINICAL DATA:  Line placement  EXAM: CHEST  1 VIEW  COMPARISON:  04/13/2015  FINDINGS: Endotracheal tube has been advanced with the tip now 3 cm above the carina.  Right central line is in place with the tip at the cavoatrial junction. No pneumothorax. There is cardiomegaly with vascular congestion. Low lung volumes with bibasilar opacities, likely atelectasis.  IMPRESSION: Right central line tip at the cavoatrial junction.  No pneumothorax.  Cardiomegaly with vascular congestion.  Low lung volumes with bibasilar atelectasis.   Electronically Signed   By: Charlett Nose M.D.   On: 04/13/2015 11:40   Dg Abd 1 View  04/13/2015   CLINICAL DATA:  Gastric bypass history. Abdominal distention. GI bleed.  EXAM: ABDOMEN - 1 VIEW  COMPARISON:  05/03/2007  FINDINGS: Nonobstructive bowel gas pattern. No organomegaly or suspicious calcification definitively visualized. Study somewhat limited by body habitus and portable nature of the study. No visible free air.  IMPRESSION: No evidence of bowel obstruction or free air.  Limited study.   Electronically Signed   By: Charlett Nose M.D.   On: 04/13/2015 11:38   Dg Chest Portable 1 View  04/13/2015   CLINICAL DATA:  CP are in progress.  EXAM: PORTABLE CHEST - 1 VIEW  COMPARISON:  To over 1920 15  FINDINGS: Endotracheal tube is identified distal tip 5.7 cm from carina. The heart size is enlarged. Mediastinal contour is normal. The aorta is tortuous. There is pulmonary edema. There is no pleural effusion. No acute abnormality is identified in the visualized bones.  IMPRESSION: Endotracheal  tube 5.7 cm from carina. There is no pneumothorax. Pulmonary edema.   Electronically Signed   By: Sherian Rein M.D.   On: 04/13/2015 07:18      Assessment & Plan: Pt is a 64 y.o. yo female with a PMHX of morbid obesity, DVT, PE, IVC filter, GERD, recent gastric bypass procedure on 8/22 at [facility], was admitted on 04/13/2015 with cardiac arrest, acute renal failure, severe coagulopathy.  1. Acute renal failure. Likely ATN . Baseline creatinine 0.6 on August 22 - Patient is oliguric at present and is undergoing volume resuscitation to stabilize  her hemodynamics - Urine output appears to be improving slightly - Electrolytes and Volume status are acceptable No acute indication for Dialysis at present   Due to critical nature of her illness and the severe complications, she may need to undergo CRRT/IHD this admission. We will continue to monitor clinical status closely and evaluate her for dialysis on a daily basis  2.  Acute respiratory failure - Currently intubated, requiring ventilator support  3. Severe coagulopathy - INR greater than 12 - She is getting FFP to reverse this  4. Recent LAPAROSCOPIC BILIOPANCREATIC DIVERSION, DUODENAL SWITCH AND HIATAL HERNIA REPAIR (N/A )  Done at [facility] on 04/07/15  Case d/w Dr Sharol Harness and Dr Clent Ridges

## 2015-04-13 NOTE — H&P (Addendum)
[facility] [place] [facility] ADMISSION H&P   PATIENT NAME: Paige [name]    MR#:  [id number]  DATE OF BIRTH:  [birth year]  DATE OF ADMISSION:  04/13/2015  PRIMARY CARE PHYSICIAN: [name], MD    REQUESTING/REFERRING PHYSICIAN: Dr Zenda Alpers  CHIEF COMPLAINT:  No chief complaint on file.   HISTORY OF PRESENT ILLNESS:  Paige [name]  is a 64 y.o. female with a known history of morbid obesity with Gastric bypass procedure on 04/07/15 at [facility], PUD last EGD in 12/2014 by Dr Markham Jordan showed healed ulcer and mild gastritis, history of PE and UE DVT in about 2013 on now coumadin (transitioned from post op Lovenox back to coumadin on 04/12/15) who presents to the ED in cardiac arrest. She was discharged after surgery on 04/11/15.  She has been tolerating liquid diet, but has not had a BM since coming home. She has had no pain. Yesterday she complained of not being able to take a deep breath and used her incentive spirometer frequently. This morning she told her daughter that she "didn't feel right" and asked her to call EMS.  In the ambulance she had asytolic arrest, code was in progress on arrival to ED. She was treated with Epinephrine x 5, 1 amp of Bicarbonate and atropine before return of circulation. She had approximately 15 minutes of rescustation. See ED note for full details.  She is being admitted to the ICU.  PAST MEDICAL HISTORY:   Past Medical History  Diagnosis Date  . Blood clot in vein 01/2012  . Arthritis   . Heart disease   . Hemorrhoids   . Hypertension   . GERD (gastroesophageal reflux disease)   . Peptic ulcer disease     PAST SURGICAL HISTORY:   Past Surgical History  Procedure Laterality Date  . Esophagogastroduodenoscopy N/A 01/08/2015    Procedure: ESOPHAGOGASTRODUODENOSCOPY (EGD);  Surgeon: Scot Jun, MD;  Location: [facility] ENDOSCOPY;  Service: Endoscopy;  Laterality: N/A;    SOCIAL HISTORY:   Social History  Substance Use Topics   . Smoking status: Never Smoker   . Smokeless tobacco: Never Used  . Alcohol Use: No    FAMILY HISTORY:   Family History  Problem Relation Age of Onset  . CAD Mother   . Hypertension Mother   . CAD Father     DRUG ALLERGIES:   Allergies  Allergen Reactions  . Lisinopril Swelling    This medication makes patient lips swell.    REVIEW OF SYSTEMS:   ROS Unable to obtain due to AMS  MEDICATIONS AT HOME:   Prior to Admission medications   Medication Sig Start Date End Date Taking? Authorizing Provider  HYDROcodone-acetaminophen (HYCET) 7.5-325 mg/15 ml solution Take 15 mLs by mouth every 4 (four) hours as needed. For up to 7 days for pain. 04/07/15 04/14/15 Yes Historical Provider, MD  ondansetron (ZOFRAN ODT) 4 MG disintegrating tablet Take 4 mg by mouth every 4 (four) hours as needed. For up to 7 days 04/07/15 04/14/15 Yes Historical Provider, MD  amLODipine (NORVASC) 5 MG tablet Take 5 mg by mouth daily.    Historical Provider, MD  aspirin 81 MG tablet Take 81 mg by mouth daily.    Historical Provider, MD  carvedilol (COREG) 25 MG tablet Take 50 mg by mouth 2 (two) times daily with a meal.    Historical Provider, MD  enoxaparin (LOVENOX) 120 MG/0.8ML injection Inject 0.8 mLs (120 mg total) into the skin every 12 (twelve) hours.  01/05/15 01/12/15  Marin Roberts, MD  furosemide (LASIX) 20 MG tablet Take 40 mg by mouth daily.    Historical Provider, MD  hydrALAZINE (APRESOLINE) 50 MG tablet Take 50 mg by mouth daily.    Historical Provider, MD  lisinopril (PRINIVIL,ZESTRIL) 20 MG tablet Take 20 mg by mouth daily.    Historical Provider, MD  omeprazole (PRILOSEC) 40 MG capsule Take 40 mg by mouth daily. 12/09/14   Historical Provider, MD  warfarin (COUMADIN) 3 MG tablet Take 9 mg by mouth daily. 11/30/14   Historical Provider, MD      VITAL SIGNS:  Blood pressure 103/82, pulse 28, temperature 94.8 F (34.9 C), temperature source Other (Comment), resp. rate 16, SpO2 96  %.  PHYSICAL EXAMINATION:  GENERAL:  64 y.o.-year-old patient lying in the bed, critically ill, intubated, dark brown material from mouth, obese EYES: Pupils equal, round, minimally reactive to light and accommodation. No scleral icterus. HEENT: Head atraumatic, normocephalic.  NECK:  Supple, no jugular venous distention. No thyroid enlargement, no tenderness.  LUNGS: mechanical ventilation, good breath sounds bilaterally, no wheezes rhonchi or rales  CARDIOVASCULAR: S1, S2 normal. No murmurs, rubs, or gallops. Tachy, irregular ABDOMEN: Soft, nontender, nondistended. Bowel sounds decreased. No organomegaly or mass.  EXTREMITIES:obese with skin thickening, +1 edema, IO in LLE, +1 pulses NEUROLOGIC: not responding to physical or verbal stimuli PSYCHIATRIC: non responsive SKIN: No obvious rash, lesion, or ulcer.   LABORATORY PANEL:   CBC  Recent Labs Lab 04/13/15 0625  WBC 7.8  HGB 9.1*  HCT 29.3*  PLT 190   ------------------------------------------------------------------------------------------------------------------  Chemistries   Recent Labs Lab 04/13/15 0625  NA 145  K 4.7  CL 108  CO2 21*  GLUCOSE 189*  BUN 78*  CREATININE 4.62*  CALCIUM 7.9*  MG 2.7*   ------------------------------------------------------------------------------------------------------------------  Cardiac Enzymes  Recent Labs Lab 04/13/15 0625  TROPONINI 0.04*   ------------------------------------------------------------------------------------------------------------------  RADIOLOGY:  Dg Chest Portable 1 View  04/13/2015   CLINICAL DATA:  CP are in progress.  EXAM: PORTABLE CHEST - 1 VIEW  COMPARISON:  To over 1920 15  FINDINGS: Endotracheal tube is identified distal tip 5.7 cm from carina. The heart size is enlarged. Mediastinal contour is normal. The aorta is tortuous. There is pulmonary edema. There is no pleural effusion. No acute abnormality is identified in the visualized  bones.  IMPRESSION: Endotracheal tube 5.7 cm from carina. There is no pneumothorax. Pulmonary edema.   Electronically Signed   By: Sherian Rein M.D.   On: 04/13/2015 07:18    EKG:   Orders placed or performed in visit on 08/22/14  . EKG 12-Lead    IMPRESSION AND PLAN:   Principal Problem:   Cardiac arrest Active Problems:   Acute renal failure   Acute respiratory failure   Acute GI bleeding  1) Cardiac arrest. Witnessed by EMS, s/p 15 minutes of resucitation - now with SROM - ECHO, cycle cardiac enzymes, monitor in ICU - urgent cardiology consultation called to Dr. Welton Flakes  - no code ICE due to GI bleeding - Critical Care following  2) shock, cardiogenic and acute blood loss/GI bleeding - dopamine with MAP goal of >65 - will need central line today  3) arrhythmia- [facility] and now atrial fibrillation - continue to monitor on tele, rate fairly well controlled at this time at 100-110  4) acute GI bleeding - urgent GI consultation called by EDP, Dr. Zenda Alpers - Will need EGD, has dark black GI contents and history of PUD, recent  gastric bypass - will get abdominal film STAT to look for free air, will need CT at some point today. EDP has been in touch with gastric surgeon. - continue protonix infusion - she has received 2 units of prbc's, continue to monitor cbc, transfuse as needed for hgb >7  5) acute renal failure - has had decreased UOP for >24 hours. Formerly with normal renal function Cr 0.7 on 8/8 - continue with aggressive hydration and BP support - no urine at this time, nephrology consultation  6) acute respiratory failure - Critical Care following - continue full ventilator support  7) metabolic acidosis - lactic acid 6.0. Possibly due to cardiac arrest, hypotension, GI bleed. Concern for perforation.  8) history of DVT and PE - has IVC filter in place, has been on lovenox since surgery took first dose of coumadin yesterday - hold anticoagulation in setting of GI  bleed, place SCD's - PTT elevated, PT/INR pending. PE does not seem likely  All the records are reviewed and case discussed with ED provider. Management plans discussed with the patient, family and they are in agreement.  Her daughter Paige [name] is her POA. She does not have a living will, is a full code.  CODE STATUS: full  TOTAL Critical Care TIME TAKING CARE OF THIS PATIENT: 65 minutes.  Greater than 50% of time spent in coordination of care and counseling.  Elby Showers M.D on 04/13/2015 at 9:13 AM  Between 7am to 6pm - Pager - [phone]  After 6pm go to [email or web address] - password [email or web address] [facility]  [place] [facility]  Office  [phone]  CC: Primary care physician; [name], MD

## 2015-04-13 NOTE — Progress Notes (Signed)
Attending Note:Events noted, patient had prolonged CPR, and were asked about whether  hypothermia  needed or not. Critical Care would have to  decide that and it was felt due GIB is not suitable candidate by them. Will check echo and cycle cardiac enzymes.

## 2015-04-13 NOTE — Consult Note (Addendum)
PULMONARY / CRITICAL CARE MEDICINE   Name: Paige [name] MRN: [id number] DOB: 06-06-1951    ADMISSION DATE:  30-Apr-2015  PT PROFILE:  55 F underwent bariatric surgery 8/22 at [facility]. Noted to have had normal renal function 8/24. Was discharged to home 8/26. EMS called to home 2023/04/30 for dyspnea. Coded in ambulance. Approx 10 mins ACLS. Admitted to ICU with profound coagulopathy (was discharged to home on enoxaparin 120 mg BID), shock, atrial fibrillation, VRDF, AKI, post anoxic encephalopathy. She has a PMH of DVT, s/p IVC filter, recent gastric ulcer  MAJOR EVENTS/TEST RESULTS: 2023-04-30 Echocardiogram:   INDWELLING DEVICES:: ETT 04/30/23 >>  R IJ CVL Apr 30, 2023 >>  R femoral A-line 04-30-23 >>   MICRO DATA: Blood 2023-04-30 >>    ANTIMICROBIALS:  Pip-tazo 04/30/23 >>    HISTORY OF PRESENT ILLNESS:  Recent history is summarized above. Pt has had increasing dyspnea over 2-3 days prior to this admission. Pt's daughter indicates that she has not been complaining of much pain. EMS was called due to moderate breathlessness. She was cognitively intact and able to assist ambulance personnel get her from bed to gurney. She subsequently suffered arrest en route to hospital.   PAST MEDICAL HISTORY :   has a past medical history of Blood clot in vein (01/2012); Arthritis; Heart disease; Hemorrhoids; Hypertension; GERD (gastroesophageal reflux disease); and Peptic ulcer disease.  has past surgical history that includes Esophagogastroduodenoscopy (N/A, 01/08/2015). Prior to Admission medications   Medication Sig Start Date End Date Taking? Authorizing Provider  amLODipine (NORVASC) 5 MG tablet Take 5 mg by mouth daily.   Yes Historical Provider, MD  aspirin 81 MG tablet Take 81 mg by mouth 2 (two) times a week.    Yes Historical Provider, MD  carvedilol (COREG) 25 MG tablet Take 50 mg by mouth 2 (two) times daily with a meal.   Yes Historical Provider, MD  cholecalciferol (VITAMIN D) 400 UNITS TABS tablet Take 800  Units by mouth every morning.   Yes Historical Provider, MD  hydrALAZINE (APRESOLINE) 50 MG tablet Take 50 mg by mouth 2 (two) times daily.    Yes Historical Provider, MD  HYDROcodone-acetaminophen (HYCET) 7.5-325 mg/15 ml solution Take 15 mLs by mouth every 4 (four) hours as needed. For up to 7 days for pain. 04/07/15 04/14/15 Yes Historical Provider, MD  omeprazole (PRILOSEC) 40 MG capsule Take 40 mg by mouth every morning.  12/09/14  Yes Historical Provider, MD  ondansetron (ZOFRAN ODT) 4 MG disintegrating tablet Take 4 mg by mouth every 4 (four) hours as needed. For up to 7 days 04/07/15 04/14/15 Yes Historical Provider, MD  warfarin (COUMADIN) 3 MG tablet Take 9 mg by mouth every evening. Take with evening meal. 11/30/14  Yes Historical Provider, MD  enoxaparin (LOVENOX) 120 MG/0.8ML injection Inject 0.8 mLs (120 mg total) into the skin every 12 (twelve) hours. 01/05/15 01/12/15  Marin Roberts, MD   Allergies  Allergen Reactions  . Lisinopril Swelling    This medication makes patient lips swell.    FAMILY HISTORY:  indicated that her mother is deceased. She indicated that her father is deceased.  SOCIAL HISTORY:  reports that she has never smoked. She has never used smokeless tobacco. She reports that she does not drink alcohol or use illicit drugs.  REVIEW OF SYSTEMS:  Unable to obtain due to AMS  SUBJECTIVE:   VITAL SIGNS: Temp:  [90.6 F (32.6 C)-97.4 F (36.3 C)] 96.4 F (35.8 C) 2023-04-30 1335) Pulse Rate:  [  24-162] 24 (08/28 1413) Resp:  [11-28] 20 (08/28 1335) BP: (48-161)/(24-149) 105/72 mmHg (08/28 1413) SpO2:  [60 %-100 %] 100 % (08/28 1413) Arterial Line BP: (80-86)/(47-58) 86/51 mmHg (08/28 1318) FiO2 (%):  [100 %] 100 % (08/28 1202) HEMODYNAMICS:   VENTILATOR SETTINGS: Vent Mode:  [-] PRVC FiO2 (%):  [100 %] 100 % Set Rate:  [16 bmp] 16 bmp Vt Set:  [450 mL] 450 mL PEEP:  [5 cmH20] 5 cmH20 INTAKE / OUTPUT:  Intake/Output Summary (Last 24 hours) at 04/13/15  1437 Last data filed at 04/13/15 1413  Gross per 24 hour  Intake    932 ml  Output    100 ml  Net    832 ml    PHYSICAL EXAMINATION: General: Morbidly obese, comatose, intubated Neuro: PERRL, no spontaneous movement, no withdrawal from pain HEENT: NCAT Cardiovascular: IRIR, mildly tachy, no M noted Lungs: coarse BS, no wheezes Abdomen: Markedly obese, maroon aspirate from NGT (now removed), maroon aspirate in JP drain, sequelae of recent surgery noted, induration in SQ tissues on lower abd Ext: 1+ symmetric LE edema  LABS:  CBC  Recent Labs Lab 04/13/15 0625 04/13/15 1130  WBC 7.8 10.3  HGB 9.1* 12.5  HCT 29.3* 38.8  PLT 190 148*   Coag's  Recent Labs Lab 04/13/15 0625 04/13/15 1130  APTT 144* 89*  INR >12.00* 10.89*   BMET  Recent Labs Lab 04/13/15 0625 04/13/15 1130  NA 145 138  K 4.7 4.4  CL 108 108  CO2 21* 20*  BUN 78* 72*  CREATININE 4.62* 3.68*  GLUCOSE 189* 164*   Electrolytes  Recent Labs Lab 04/13/15 0625 04/13/15 1130  CALCIUM 7.9* 6.9*  MG 2.7*  --    Sepsis Markers  Recent Labs Lab 04/13/15 0642 04/13/15 1130  LATICACIDVEN 6.3* 1.7   ABG  Recent Labs Lab 04/13/15 0639 04/13/15 1400  PHART 7.29* 7.35  PCO2ART 37 34  PO2ART 174* 209*   Liver Enzymes  Recent Labs Lab 04/13/15 1130  AST 179*  ALT 90*  ALKPHOS 64  BILITOT 1.8*  ALBUMIN 1.8*   Cardiac Enzymes  Recent Labs Lab 04/13/15 0625 04/13/15 1130  TROPONINI 0.04* 0.03   Glucose  Recent Labs Lab 04/13/15 1055  GLUCAP 141*    CXR: Low volumes, vasc congestion, no edema or infiltrates   ASSESSMENT / PLAN:  CARDIOVASCULAR A: Cardiac arrest - inciting event unclear Shock - suspect in part hemorrhagic. Doubt septic Strongly doubt PE as she is profoundly coagulopathic and has IVC filter New onset AF P:  CVP goal 12-15. NS boluses ordered MAP goal > 65 mmHg Cards following. 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, s/p IVC filter P:   Vent settings established Vent bundle implemented Daily SBT as indicated   RENAL A:   AKI - appears to have been developing over days PTA P:   Monitor BMET intermittently Monitor I/Os Correct electrolytes as indicated Renal service following  GASTROINTESTINAL A:   Post bariatric surgery 8/22 Morbid obesity P:   SUP: IV PPI (currently continuous infusion) Consider TFs when hemodynamically stable GI service following Will need to discuss with GI recommendations re: G-tube placement for nutrition while on vent  HEMATOLOGIC A:  Severe coagulopathy - suspect due to enoxaparin in setting of AKI Acute blood loss anemia P:  Transfuse PRBCs to maintain Hgb > 9.0 for now Transfuse FFP  to maintain PT-INR < 1.5, PTT < 40 sec for now Monitor CBC, coags frequently over next couple of days' DIC panel ordered for 8/29  INFECTIOUS A:   Concern for intra-abdominal infection P:   Monitor temp, WBC count Micro and abx as above  ENDOCRINE A:   Hypeglycemia P:   SSI, resistant scale  NEUROLOGIC A:   Post anoxic encephalopathy Not a candidate for hypothermia due to coagulopathy P:   RASS goal: 0, -1 Minimize sedatives Once hemodynamically stabilized, consider CT head and EEG if warranted   FAMILY  8/28  I spoke with pt's daughter and provided a complete update including a summary of the multiple derangements identified above and the guarded prognosis   CCM time:  75 mins independent of time spent performing procedure    Billy Fischer, MD PCCM service Mobile [phone] Pager [phone]  04/13/2015, 2:37 PM

## 2015-04-13 NOTE — Progress Notes (Signed)
   04/13/15 1300  Clinical Encounter Type  Visited With Patient and family together  Visit Type Initial  Referral From Nurse  Consult/Referral To Chaplain  Spiritual Encounters  Spiritual Needs Prayer;Emotional  Stress Factors  Patient Stress Factors Health changes;Major life changes  Family Stress Factors Family relationships;Health changes;Major life changes  Met with family in patient's room. Provided pastoral care, grief support, & prayer. Chap. Tayton Decaire G. [place]

## 2015-04-13 NOTE — Progress Notes (Signed)
GI Note:  Full consult to follow.   Her her clinical presentation( SOB for 24 hours followed by cardiac arrest), Hgb  9 on presentation, appropriate response to 4U prbc to Hgb of 12.5 would argue against hemorrhage as the cause of her cardiac arrest.  She did not have any melena, rectal bleeding, hematemesis although does appear to have blood from  drain and NG tube that are likely oozing from an INR of 12.   Would recommend continuing to pursue other causes of SOB and cardiac arrest. If evidence of further active bleeding such as melena, hematemesis, large amount of bloody output from NG tube, then would continue to reverse INR and consider EGD.

## 2015-04-13 NOTE — Progress Notes (Signed)
ANTIBIOTIC CONSULT NOTE - INITIAL  Pharmacy Consult for Zosyn Indication: rule out sepsis  Allergies  Allergen Reactions  . Lisinopril Swelling    This medication makes patient lips swell.    Patient Measurements:   Adjusted Body Weight: n/a  Vital Signs: Temp: 95 F (35 C) (08/28 1200) Temp Source: Core (Comment) (08/28 1145) BP: 90/56 mmHg (08/28 1145) Pulse Rate: 27 (08/28 1200) Intake/Output from previous day: 08/27 0701 - 08/28 0700 In: -  Out: 100 [Drains:100] Intake/Output from this shift: Total I/O In: 300 [I.V.:300] Out: -   Labs:  Recent Labs  04/13/15 0625  WBC 7.8  HGB 9.1*  PLT 190  CREATININE 4.62*   CrCl cannot be calculated (Unknown ideal weight.). No results for input(s): VANCOTROUGH, VANCOPEAK, VANCORANDOM, GENTTROUGH, GENTPEAK, GENTRANDOM, TOBRATROUGH, TOBRAPEAK, TOBRARND, AMIKACINPEAK, AMIKACINTROU, AMIKACIN in the last 72 hours.   Microbiology: No results found for this or any previous visit (from the past 720 hour(s)).  Medical History: Past Medical History  Diagnosis Date  . Blood clot in vein 01/2012  . Arthritis   . Heart disease   . Hemorrhoids   . Hypertension   . GERD (gastroesophageal reflux disease)   . Peptic ulcer disease     Medications:  Anti-infectives    Start     Dose/Rate Route Frequency Ordered Stop   04/13/15 1230  piperacillin-tazobactam (ZOSYN) IVPB 3.375 g     3.375 g 12.5 mL/hr over 240 Minutes Intravenous Every 12 hours 04/13/15 1219       Assessment: Patient admitted in cardiac arrest. Patient was discharged from [facility] on 8/26 following a gastric bypass. Patient in acute renal failure and GI bleed. Empirically started on Zosyn for possible sepsis.  Goal of Therapy:  Resolution of symptoms  Plan:  Follow up culture results Will order Zosyn 3.375g IV q12h EI.  Clovia Cuff, PharmD, BCPS 04/13/2015 12:27 PM

## 2015-04-14 ENCOUNTER — Inpatient Hospital Stay: Payer: BLUE CROSS/BLUE SHIELD

## 2015-04-14 ENCOUNTER — Encounter: Payer: Self-pay | Admitting: *Deleted

## 2015-04-14 DIAGNOSIS — G9341 Metabolic encephalopathy: Secondary | ICD-10-CM

## 2015-04-14 DIAGNOSIS — Z515 Encounter for palliative care: Secondary | ICD-10-CM

## 2015-04-14 DIAGNOSIS — G931 Anoxic brain damage, not elsewhere classified: Secondary | ICD-10-CM

## 2015-04-14 DIAGNOSIS — I469 Cardiac arrest, cause unspecified: Secondary | ICD-10-CM

## 2015-04-14 DIAGNOSIS — J9600 Acute respiratory failure, unspecified whether with hypoxia or hypercapnia: Secondary | ICD-10-CM

## 2015-04-14 DIAGNOSIS — A4189 Other specified sepsis: Secondary | ICD-10-CM

## 2015-04-14 DIAGNOSIS — Z9884 Bariatric surgery status: Secondary | ICD-10-CM

## 2015-04-14 DIAGNOSIS — D649 Anemia, unspecified: Secondary | ICD-10-CM

## 2015-04-14 DIAGNOSIS — A419 Sepsis, unspecified organism: Secondary | ICD-10-CM

## 2015-04-14 DIAGNOSIS — J811 Chronic pulmonary edema: Secondary | ICD-10-CM

## 2015-04-14 DIAGNOSIS — R400 Somnolence: Secondary | ICD-10-CM

## 2015-04-14 DIAGNOSIS — Z9911 Dependence on respirator [ventilator] status: Secondary | ICD-10-CM

## 2015-04-14 DIAGNOSIS — T8112XA Postprocedural septic shock, initial encounter: Secondary | ICD-10-CM

## 2015-04-14 LAB — TYPE AND SCREEN
ABO/RH(D): AB POS
Antibody Screen: NEGATIVE
UNIT DIVISION: 0
Unit division: 0
Unit division: 0
Unit division: 0

## 2015-04-14 LAB — PREPARE FRESH FROZEN PLASMA
UNIT DIVISION: 0
Unit division: 0
Unit division: 0
Unit division: 0

## 2015-04-14 LAB — CBC
HCT: 31.5 % — ABNORMAL LOW (ref 35.0–47.0)
HCT: 31.5 % — ABNORMAL LOW (ref 35.0–47.0)
HCT: 34.5 % — ABNORMAL LOW (ref 35.0–47.0)
HEMATOCRIT: 34.7 % — AB (ref 35.0–47.0)
HEMATOCRIT: 29.1 % — AB (ref 35.0–47.0)
HEMOGLOBIN: 10.3 g/dL — AB (ref 12.0–16.0)
HEMOGLOBIN: 10.3 g/dL — AB (ref 12.0–16.0)
Hemoglobin: 11.1 g/dL — ABNORMAL LOW (ref 12.0–16.0)
Hemoglobin: 11.2 g/dL — ABNORMAL LOW (ref 12.0–16.0)
Hemoglobin: 9.5 g/dL — ABNORMAL LOW (ref 12.0–16.0)
MCH: 25.7 pg — AB (ref 26.0–34.0)
MCH: 25.7 pg — ABNORMAL LOW (ref 26.0–34.0)
MCH: 25.8 pg — ABNORMAL LOW (ref 26.0–34.0)
MCH: 25.9 pg — AB (ref 26.0–34.0)
MCH: 25.9 pg — ABNORMAL LOW (ref 26.0–34.0)
MCHC: 32.2 g/dL (ref 32.0–36.0)
MCHC: 32.3 g/dL (ref 32.0–36.0)
MCHC: 32.5 g/dL (ref 32.0–36.0)
MCHC: 32.6 g/dL (ref 32.0–36.0)
MCHC: 32.6 g/dL (ref 32.0–36.0)
MCV: 79.4 fL — AB (ref 80.0–100.0)
MCV: 79.4 fL — AB (ref 80.0–100.0)
MCV: 79.5 fL — AB (ref 80.0–100.0)
MCV: 79.5 fL — AB (ref 80.0–100.0)
MCV: 79.9 fL — AB (ref 80.0–100.0)
PLATELETS: 121 10*3/uL — AB (ref 150–440)
PLATELETS: 126 10*3/uL — AB (ref 150–440)
PLATELETS: 146 10*3/uL — AB (ref 150–440)
PLATELETS: 147 10*3/uL — AB (ref 150–440)
PLATELETS: 95 10*3/uL — AB (ref 150–440)
RBC: 3.67 MIL/uL — ABNORMAL LOW (ref 3.80–5.20)
RBC: 3.96 MIL/uL (ref 3.80–5.20)
RBC: 3.96 MIL/uL (ref 3.80–5.20)
RBC: 4.31 MIL/uL (ref 3.80–5.20)
RBC: 4.37 MIL/uL (ref 3.80–5.20)
RDW: 18.6 % — ABNORMAL HIGH (ref 11.5–14.5)
RDW: 18.9 % — AB (ref 11.5–14.5)
RDW: 18.9 % — ABNORMAL HIGH (ref 11.5–14.5)
RDW: 19 % — AB (ref 11.5–14.5)
RDW: 19.1 % — AB (ref 11.5–14.5)
WBC: 16.1 10*3/uL — AB (ref 3.6–11.0)
WBC: 17.5 10*3/uL — AB (ref 3.6–11.0)
WBC: 17.5 10*3/uL — AB (ref 3.6–11.0)
WBC: 18.2 10*3/uL — ABNORMAL HIGH (ref 3.6–11.0)
WBC: 18.4 10*3/uL — AB (ref 3.6–11.0)

## 2015-04-14 LAB — PROTIME-INR
INR: 1.69
INR: 1.76
Prothrombin Time: 20.1 seconds — ABNORMAL HIGH (ref 11.4–15.0)
Prothrombin Time: 20.7 seconds — ABNORMAL HIGH (ref 11.4–15.0)

## 2015-04-14 LAB — BLOOD GAS, ARTERIAL
ACID-BASE DEFICIT: 5.9 mmol/L — AB (ref 0.0–2.0)
ACID-BASE DEFICIT: 2.7 mmol/L — AB (ref 0.0–2.0)
BICARBONATE: 18.8 meq/L — AB (ref 21.0–28.0)
Bicarbonate: 20.4 mEq/L — ABNORMAL LOW (ref 21.0–28.0)
FIO2: 1
FIO2: 0.5
LHR: 16 {breaths}/min
MECHANICAL RATE: 16
MECHVT: 450 mL
MECHVT: 450 mL
O2 SAT: 99.7 %
O2 Saturation: 97.4 %
PATIENT TEMPERATURE: 37
PCO2 ART: 34 mmHg (ref 32.0–48.0)
PCO2 ART: 30 mmHg — AB (ref 32.0–48.0)
PEEP/CPAP: 5 cmH2O
PEEP: 5 cmH2O
PH ART: 7.35 (ref 7.350–7.450)
PO2 ART: 209 mmHg — AB (ref 83.0–108.0)
PO2 ART: 92 mmHg (ref 83.0–108.0)
Patient temperature: 37
pH, Arterial: 7.44 (ref 7.350–7.450)

## 2015-04-14 LAB — SEDIMENTATION RATE: Sed Rate: >140 mm/hr — ABNORMAL HIGH (ref 0–22)

## 2015-04-14 LAB — FIBRIN DEGRADATION PROD.(ARMC ONLY): Fibrin Degradation Prod.: <10 ug/mL — AB (ref <10)

## 2015-04-14 LAB — GLUCOSE, CAPILLARY
GLUCOSE-CAPILLARY: 133 mg/dL — AB (ref 65–99)
Glucose-Capillary: 101 mg/dL — ABNORMAL HIGH (ref 65–99)
Glucose-Capillary: 115 mg/dL — ABNORMAL HIGH (ref 65–99)
Glucose-Capillary: 116 mg/dL — ABNORMAL HIGH (ref 65–99)
Glucose-Capillary: 131 mg/dL — ABNORMAL HIGH (ref 65–99)
Glucose-Capillary: 145 mg/dL — ABNORMAL HIGH (ref 65–99)

## 2015-04-14 LAB — LACTIC ACID, PLASMA: LACTIC ACID, VENOUS: 1.8 mmol/L (ref 0.5–2.0)

## 2015-04-14 LAB — COMPREHENSIVE METABOLIC PANEL
ALK PHOS: 53 U/L (ref 38–126)
ALT: 62 U/L — AB (ref 14–54)
AST: 85 U/L — AB (ref 15–41)
Albumin: 2 g/dL — ABNORMAL LOW (ref 3.5–5.0)
Anion gap: 11 (ref 5–15)
BILIRUBIN TOTAL: 3.2 mg/dL — AB (ref 0.3–1.2)
BUN: 78 mg/dL — AB (ref 6–20)
CALCIUM: 7.7 mg/dL — AB (ref 8.9–10.3)
CHLORIDE: 110 mmol/L (ref 101–111)
CO2: 21 mmol/L — ABNORMAL LOW (ref 22–32)
CREATININE: 3.18 mg/dL — AB (ref 0.44–1.00)
GFR, EST AFRICAN AMERICAN: 17 mL/min — AB (ref >60)
GFR, EST NON AFRICAN AMERICAN: 14 mL/min — AB (ref >60)
Glucose, Bld: 138 mg/dL — ABNORMAL HIGH (ref 65–99)
Potassium: 3.8 mmol/L (ref 3.5–5.1)
Sodium: 142 mmol/L (ref 135–145)
TOTAL PROTEIN: 5.9 g/dL — AB (ref 6.5–8.1)

## 2015-04-14 LAB — FIBRINOGEN

## 2015-04-14 LAB — TROPONIN I: TROPONIN I: 0.03 ng/mL (ref <0.031)

## 2015-04-14 LAB — VITAMIN B12: Vitamin B-12: 3600 pg/mL — ABNORMAL HIGH (ref 180–914)

## 2015-04-14 LAB — FIBRIN DERIVATIVES D-DIMER (ARMC ONLY): FIBRIN DERIVATIVES D-DIMER (ARMC): 1206 — AB (ref 0–499)

## 2015-04-14 LAB — APTT
APTT: 42 s — AB (ref 24–36)
aPTT: 41 seconds — ABNORMAL HIGH (ref 24–36)

## 2015-04-14 LAB — PROCALCITONIN: Procalcitonin: 66.58 ng/mL

## 2015-04-14 LAB — TSH: TSH: 5.38 u[IU]/mL — AB (ref 0.350–4.500)

## 2015-04-14 LAB — FOLATE: FOLATE: 5.3 ng/mL — AB (ref >5.9)

## 2015-04-14 MED ORDER — THIAMINE HCL 100 MG/ML IJ SOLN
100.0000 mg | Freq: Every day | INTRAMUSCULAR | Status: DC
  Administered 2015-04-14 – 2015-04-18 (×5): 100 mg via INTRAVENOUS
  Filled 2015-04-14 (×5): qty 2

## 2015-04-14 MED ORDER — SODIUM CHLORIDE 0.9 % IV SOLN
50.0000 mg | Freq: Two times a day (BID) | INTRAVENOUS | Status: DC
  Administered 2015-04-14 – 2015-04-18 (×8): 50 mg via INTRAVENOUS
  Filled 2015-04-14 (×12): qty 5

## 2015-04-14 MED ORDER — PIPERACILLIN-TAZOBACTAM 3.375 G IVPB
3.3750 g | Freq: Three times a day (TID) | INTRAVENOUS | Status: DC
  Administered 2015-04-14 – 2015-04-15 (×4): 3.375 g via INTRAVENOUS
  Filled 2015-04-14 (×7): qty 50

## 2015-04-14 MED ORDER — VASOPRESSIN 20 UNIT/ML IV SOLN: 0.0300 [IU]/min | INTRAVENOUS | Status: DC

## 2015-04-14 MED ORDER — HYDROCORTISONE NA SUCCINATE PF 100 MG IJ SOLR
50.0000 mg | Freq: Four times a day (QID) | INTRAMUSCULAR | Status: DC
  Administered 2015-04-14 – 2015-04-15 (×4): 50 mg via INTRAVENOUS
  Filled 2015-04-14 (×4): qty 2

## 2015-04-14 MED ORDER — VASOPRESSIN 20 UNIT/ML IV SOLN
0.0300 [IU]/min | INTRAVENOUS | Status: DC
  Administered 2015-04-14: 0.03 [IU]/min via INTRAVENOUS
  Filled 2015-04-14: qty 2

## 2015-04-14 NOTE — Progress Notes (Signed)
GI Note:  There continues to be no evidence of bleeding.    Appears this was all due to GNR septic shock.    I recommend Ct a/p to look for possible drainable collection.    I will sign off.  Please call with questions.

## 2015-04-14 NOTE — Clinical Documentation Improvement (Signed)
Hospitalist  Can the diagnosis of CHF be further specified?    Acuity - Acute, Chronic, Acute on Chronic   Type--Systolic, Diastolic, Systolic and Diastolic  Other  Clinically Undetermined   Document any associated diagnoses/conditions  Supporting Information: Patient with a history of CHF per 8/28 progress notes.  8/29: CXR: Cardiomegaly with pulmonary edema.   Please exercise your independent, professional judgment when responding. A specific answer is not anticipated or expected.   Thank You,  Rodman Pickle [employer] Information Management [place]

## 2015-04-14 NOTE — Progress Notes (Signed)
Have left message with Dr. Roderic Ovens office.

## 2015-04-14 NOTE — Progress Notes (Signed)
Initial Nutrition Assessment    INTERVENTION:   Coordination of care: If unable to wean from vent within 24-48 hr recommend evaluating pt for nutrition support  NUTRITION DIAGNOSIS:   Inadequate oral intake related to altered GI function as evidenced by NPO status.   GOAL:   Provide needs based on ASPEN/SCCM guidelines   MONITOR:    (Energy intake, Electrolyte and renal profile, digestive system)  REASON FOR ASSESSMENT:   Ventilator    ASSESSMENT:      Pt admitted following cardiac arrest on vent. 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 Blood clot in vein 01/2012  . Arthritis   . Heart disease   . Hemorrhoids   . Hypertension   . GERD (gastroesophageal reflux disease)   . Peptic ulcer disease     Current Nutrition: NPO  Food/Nutrition-Related History: unable to obtain at this time, likely on liquids post op surgery   Medications: levophed, adding vasopressin today  Electrolyte/Renal Profile and Glucose Profile:   Recent Labs Lab 04/13/15 0625 04/13/15 1130 04/13/15 2157 04/14/15 0504  NA 145 138 141 142  K 4.7 4.4 3.7 3.8  CL 108 108 110 110  CO2 21* 20* 21* 21*  BUN 78* 72* 75* 78*  CREATININE 4.62* 3.68* 3.48* 3.18*  CALCIUM 7.9* 6.9* 7.5* 7.7*  MG 2.7*  --   --   --   GLUCOSE 189* 164* 141* 138*   Protein Profile:  Recent Labs Lab 04/13/15 1130 04/14/15 0504  ALBUMIN 1.8* 2.0*    Gastrointestinal Profile: foul smelling drainage from JP drain noted,  Last BM: unknown   Nutrition-Focused Physical Exam Findings:  Unable to complete Nutrition-Focused physical exam at this time.     Weight Change: unable to determine    Diet Order:  Diet NPO time specified  Skin:   reviewed   Height:   Ht Readings from Last 1 Encounters:  04/13/15  (1.6 m)    Weight:   Wt Readings from Last 1 Encounters:  04/13/15 381 lb 6.3 oz (173 kg)    Ideal Body  Weight:   52 kg  BMI:  Body mass index is 67.58 kg/(m^2).  Estimated Nutritional Needs:   Kcal:  (22-25 kcals/kg IBW) 1144-1300 kcals/d (Using IBW of 52kg)  Protein:  (2.0-2.5 g/IBW) 104-130 g/d  Fluid:  (25-33ml/kg) 1300-1544ml/d  EDUCATION NEEDS:   No education needs identified at this time  HIGH Care Level  Farzad Tibbetts B. Freida Busman, RD, LDN [phone] (pager)

## 2015-04-14 NOTE — Progress Notes (Addendum)
Palliative Care Update   Palliative Care Consult is initiated.  Daughter and son-in-law are here and daughter is Management consultant (the only child).  Daughter has been presented with information about what a second 'code blue' scenario might involved and she does not wish for her mother to suffer.  Pt is therefore made a 'limited code status' at this time --so that current interventions can continue, but a compressions, cardioversions, or meds for asystole are not to be given in the event of another cardiac arrest.    We will wait and see what is said by the neurologist after the EEG is read.  Clinically, her lack of responsiveness is quite severe and I did convey to the daughter that 'it doesn't look good'.    She was informed that we might be having more serious conversations tomorrow, but we must wait on more information.   See full note to follow.

## 2015-04-14 NOTE — Progress Notes (Signed)
   SUBJECTIVE: intubated, sedated, daughter at bed side. States she does not have hx of CAD or CHF.    Filed Vitals:   04/14/15 0600 04/14/15 0700 04/14/15 0808 04/14/15 0830  BP: 117/58 100/64 114/86 100/73  Pulse: 103 97 42 98  Temp: 98.8 F (37.1 C) 98.8 F (37.1 C) 99 F (37.2 C) 99 F (37.2 C)  TempSrc:      Resp: Height:      Weight:      SpO2: 98% 98% 99% 97%    Intake/Output Summary (Last 24 hours) at 04/14/15 1012 Last data filed at 04/14/15 0800  Gross per 24 hour  Intake 2538.26 ml  Output   2235 ml  Net 303.26 ml    LABS: Basic Metabolic Panel:  Recent Labs  40/98/11 0625  04/13/15 2157 04/14/15 0504  NA 145  < > 141 142  K 4.7  < > 3.7 3.8  CL 108  < > 110 110  CO2 21*  < > 21* 21*  GLUCOSE 189*  < > 141* 138*  BUN 78*  < > 75* 78*  CREATININE 4.62*  < > 3.48* 3.18*  CALCIUM 7.9*  < > 7.5* 7.7*  MG 2.7*  --   --   --   < > = values in this interval not displayed. Liver Function Tests:  Recent Labs  04/13/15 1130 04/14/15 0504  AST 179* 85*  ALT 90* 62*  ALKPHOS 64 53  BILITOT 1.8* 3.2*  PROT 5.6* 5.9*  ALBUMIN 1.8* 2.0*   No results for input(s): LIPASE, AMYLASE in the last 72 hours. CBC:  Recent Labs  04/13/15 2344 04/14/15 0504  WBC 16.1* 17.5*  HGB 11.1* 11.2*  HCT 34.5* 34.7*  MCV 79.9* 79.4*  PLT 147* 146*   Cardiac Enzymes:  Recent Labs  04/13/15 1718 04/13/15 2157 04/14/15 0504  TROPONINI <0.03 <0.03 0.03   BNP: Invalid input(s): POCBNP D-Dimer: No results for input(s): DDIMER in the last 72 hours. Hemoglobin A1C: No results for input(s): HGBA1C in the last 72 hours. Fasting Lipid Panel: No results for input(s): CHOL, HDL, LDLCALC, TRIG, CHOLHDL, LDLDIRECT in the last 72 hours. Thyroid Function Tests: No results for input(s): TSH, T4TOTAL, T3FREE, THYROIDAB in the last 72 hours.  Invalid input(s): FREET3 Anemia Panel: No results for input(s): VITAMINB12, FOLATE, FERRITIN, TIBC, IRON,  RETICCTPCT in the last 72 hours.   PHYSICAL EXAM General: critcally ill appearing HEENT:  Normocephalic and atramatic Neck:  No JVD.  Lungs: rhonchi b/l Heart: irregularly irregular Abdomen: Bowel sounds are positive, abdomen soft and non-tender  Msk:  Back normal, normal gait. Normal strength and tone for age. Extremities: trace pedal edema b/l      TELEMETRY: Reviewed telemetry pt in a-fib 90s VR  ASSESSMENT AND PLAN: pt s/p cardiac arrest, troponin negative x3. Likely 2/2 septic shock.  CHF: Echo shows decreased EF (35%).   Atrial fibrillation: Amiodarone gtt never started yesterday 2/2 hypotension, rate controlled.    Patient and plan discussed with supervising provider, Dr. Adrian Blackwater, who agrees with above findings.   Paige [name] Margarito Courser [facility]  04/14/2015 10:12 AM

## 2015-04-14 NOTE — Progress Notes (Signed)
[facility] - [place] at [facility]   PATIENT NAME: Paige [name]    MR#:  [id number]  DATE OF BIRTH:  [birth year]  SUBJECTIVE:  CHIEF COMPLAINT:  No chief complaint on file.  Continues on full ventilator support, pressors, non responsive without sedation.   REVIEW OF SYSTEMS:   ROS unable to obtain due to AMS  DRUG ALLERGIES:   Allergies  Allergen Reactions  . Lisinopril Swelling    This medication makes patient lips swell.    VITALS:  Blood pressure 100/73, pulse 98, temperature 99 F (37.2 C), temperature source Core (Comment), resp. rate 21, height 5\' 3"  (1.6 m), weight 173 kg (381 lb 6.3 oz), SpO2 97 %.  PHYSICAL EXAMINATION:  GENERAL:  64 y.o.-year-old patient lying in the bed critically ill, on ventilator EYES: Pupils equal, round, sluggish reaction. No scleral icterus.  LUNGS: mechanical ventilation, fair air movement, no wheezes or rhonchi.  CARDIOVASCULAR: S1, S2 normal. No murmurs, rubs, or gallops.  ABDOMEN: Soft, nondistended. Bowel sounds decrease, laparoscopic surgery wounds are clean and dry, dark thin material in JP drain.  EXTREMITIES: No pedal edema, cyanosis, or clubbing. Pulses +1 NEUROLOGIC: non responsive to physical or verbal stimuli PSYCHIATRIC: non responsive SKIN: surgical wounds are clean and dry  LABORATORY PANEL:   CBC  Recent Labs Lab 04/14/15 1257  WBC 18.4*  HGB 10.3*  HCT 31.5*  PLT 126*   ------------------------------------------------------------------------------------------------------------------  Chemistries   Recent Labs Lab 04/13/15 0625  04/14/15 0504  NA 145  < > 142  K 4.7  < > 3.8  CL 108  < > 110  CO2 21*  < > 21*  GLUCOSE 189*  < > 138*  BUN 78*  < > 78*  CREATININE 4.62*  < > 3.18*  CALCIUM 7.9*  < > 7.7*  MG 2.7*  --   --   AST  --   < > 85*  ALT  --   < > 62*  ALKPHOS  --   < > 53  BILITOT  --   < > 3.2*  < > = values in this interval not  displayed. ------------------------------------------------------------------------------------------------------------------  Cardiac Enzymes  Recent Labs Lab 04/14/15 0504  TROPONINI 0.03   ------------------------------------------------------------------------------------------------------------------  RADIOLOGY:  Dg Chest 1 View  04/13/2015   CLINICAL DATA:  Line placement  EXAM: CHEST  1 VIEW  COMPARISON:  04/13/2015  FINDINGS: Endotracheal tube has been advanced with the tip now 3 cm above the carina. Right central line is in place with the tip at the cavoatrial junction. No pneumothorax. There is cardiomegaly with vascular congestion. Low lung volumes with bibasilar opacities, likely atelectasis.  IMPRESSION: Right central line tip at the cavoatrial junction.  No pneumothorax.  Cardiomegaly with vascular congestion.  Low lung volumes with bibasilar atelectasis.   Electronically Signed   By: Charlett Nose M.D.   On: 04/13/2015 11:40   Dg Abd 1 View  04/13/2015   CLINICAL DATA:  Gastric bypass history. Abdominal distention. GI bleed.  EXAM: ABDOMEN - 1 VIEW  COMPARISON:  05/03/2007  FINDINGS: Nonobstructive bowel gas pattern. No organomegaly or suspicious calcification definitively visualized. Study somewhat limited by body habitus and portable nature of the study. No visible free air.  IMPRESSION: No evidence of bowel obstruction or free air.  Limited study.   Electronically Signed   By: Charlett Nose M.D.   On: 04/13/2015 11:38   Ct Head Wo Contrast  04/14/2015   CLINICAL DATA:  Cardiac arrest yesterday  with encephalopathy.  EXAM: CT HEAD WITHOUT CONTRAST  TECHNIQUE: Contiguous axial images were obtained from the base of the skull through the vertex without intravenous contrast.  COMPARISON:  None.  FINDINGS: Skull and Sinuses:Negative for fracture or destructive process.  Fluid levels in the sphenoid sinuses, likely from intubation status.  Orbits: No acute abnormality.  Brain: Motion  degraded imaging, limiting diagnostic sensitivity and specificity.  There is indistinct gray-white junction multiple levels, not entirely explained by motion. Deep gray nuclei are visible and symmetric. No hemorrhage, hydrocephalus, or shift.  IMPRESSION: 1. Limited motion degraded study. 2. Poor gray-white differentiation, possible cytotoxic edema from hypoxic ischemic injury. 3. No intracranial hemorrhage or herniation.   Electronically Signed   By: Marnee Spring M.D.   On: 04/14/2015 12:03   Dg Chest Port 1 View  04/14/2015   CLINICAL DATA:  Respiratory failure  EXAM: PORTABLE CHEST - 1 VIEW  COMPARISON:  April 13, 2015  FINDINGS: The heart size and mediastinal contours are stable. The heart size enlarged. Aorta is tortuous. Endotracheal tube is identified distal tip 4.5 cm from carina. Right central venous line is identified with distal tip probably in the right atrium. There is pulmonary edema. This probably a small pleural effusion. Patchy opacities identified in bilateral medial lung bases probably due to atelectasis. The visualized skeletal structures are stable.  IMPRESSION: Cardiomegaly with pulmonary edema.   Electronically Signed   By: Sherian Rein M.D.   On: 04/14/2015 07:29   Dg Chest Portable 1 View  04/13/2015   CLINICAL DATA:  CP are in progress.  EXAM: PORTABLE CHEST - 1 VIEW  COMPARISON:  To over 1920 15  FINDINGS: Endotracheal tube is identified distal tip 5.7 cm from carina. The heart size is enlarged. Mediastinal contour is normal. The aorta is tortuous. There is pulmonary edema. There is no pleural effusion. No acute abnormality is identified in the visualized bones.  IMPRESSION: Endotracheal tube 5.7 cm from carina. There is no pneumothorax. Pulmonary edema.   Electronically Signed   By: Sherian Rein M.D.   On: 04/13/2015 07:18    EKG:   Orders placed or performed during the hospital encounter of 04/13/15  . EKG 12-Lead  . 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, had been on high dose lovenox after srugery, only one dose of coumadin prior to admission - fibrinogen elevated, plt's slightly low, not DIC  6) gi bleeding/history of PUD - Dr Shelle Iron feels NG bloody output more likely due to NG tube trauma - continue protonix infusion  7) sepsis and g - bacteremia - g - rod bacteremia, covering with zosyn, sensitivities pending - WBC increasing - likely GI source  8) acute renal failure - Cr improving slowly, UOP improving - continue BP support   9) acute systolic congestive heart failure - ECHO with ef 35%, no prior history of CHF - if survives will need further work up  10) post operative complication - concern for purulent/excessive drainage from JP drain. Dr. Smitty Cords saw the patient yesterday and spoke with family, but did not leave a note. He should be back today.  Concern for ischemic bowel causing lactic acidosis, sepsis, multiorgan failure.  All the records are reviewed and case discussed with the patient's daughter Synetta Fail and son in Social worker. Also discussed with Dr. Belia Heman, Dr. Katrinka Blazing and Dr. Orvan Falconer, palliative care. Consult ordered.  CODE STATUS: full  TOTAL CRITICAL CARE TIME TAKING CARE OF THIS PATIENT: 55 minutes.  Greater than 50% of time spent in care coordination and counseling. Overall prognosis poor.  Elby Showers M.D on 04/14/2015 at 2:33 PM  Between 7am to  6pm - Pager - [phone]  After 6pm go to [email or web address] - password [email or web address] [facility]  [facility]  Office  [phone]  CC: Primary care physician; [name], MD

## 2015-04-14 NOTE — Progress Notes (Signed)
[facility]  ROUNDING NOTE   Subjective:  Patient had recent gastric bypass. Post operative course complicated by acute renal failure, cardiac/asystole arrest. She remains on the ventilator at this time.  Cr is improving.  UOP of 975cc yesterday. Daughter currently at bedside.   Objective:  Vital signs in last 24 hours:  Temp:  [94.8 F (34.9 C)-99.1 F (37.3 C)] 99 F (37.2 C) (08/29 0830) Pulse Rate:  [24-117] 98 (08/29 0830) Resp:  [18-30] 21 (08/29 0830) BP: (82-124)/(42-94) 100/73 mmHg (08/29 0830) SpO2:  [81 %-100 %] 97 % (08/29 0830) Arterial Line BP: (69-106)/(38-63) 96/57 mmHg (08/29 0830) FiO2 (%):  [50 %-100 %] 50 % (08/29 0757) Weight:  [173 kg (381 lb 6.3 oz)] 173 kg (381 lb 6.3 oz) (08/28 1500)  Weight change:  Filed Weights   04/13/15 1500  Weight: 173 kg (381 lb 6.3 oz)    Intake/Output: I/O last 3 completed shifts: In: 2652.4 [I.V.:1293.4; Blood:1249; IV Piggyback:110] Out: 2335 [Urine:975; Drains:1185; Other:175]   Intake/Output this shift:  Total I/O In: 185.8 [I.V.:185.8] Out: 0   Physical Exam: General: Critically ill appearing   Head: Normocephalic, ETT in place  Eyes: Eyes closed  Neck: Supple, trachea midline  Lungs:  Clear to auscultation vent assisted  Heart: S1S2 no rubs  Abdomen:  Soft, distended, abdominal tube noted to bein place  Extremities:  trace peripheral edema  Neurologic: Obtunded at present  Skin: No lesions       Basic Metabolic Panel:  Recent Labs Lab 04/13/15 0625 04/13/15 1130 04/13/15 2157 04/14/15 0504  NA 145 138 141 142  K 4.7 4.4 3.7 3.8  CL 108 108 110 110  CO2 21* 20* 21* 21*  GLUCOSE 189* 164* 141* 138*  BUN 78* 72* 75* 78*  CREATININE 4.62* 3.68* 3.48* 3.18*  CALCIUM 7.9* 6.9* 7.5* 7.7*  MG 2.7*  --   --   --     Liver Function Tests:  Recent Labs Lab 04/13/15 1130 04/14/15 0504  AST 179* 85*  ALT 90* 62*  ALKPHOS 64 53  BILITOT 1.8* 3.2*  PROT 5.6* 5.9*  ALBUMIN 1.8*  2.0*   No results for input(s): LIPASE, AMYLASE in the last 168 hours. No results for input(s): AMMONIA in the last 168 hours.  CBC:  Recent Labs Lab 04/13/15 0625 04/13/15 1130 04/13/15 1718 04/13/15 2344 04/14/15 0504  WBC 7.8 10.3 11.2* 16.1* 17.5*  HGB 9.1* 12.5 11.0* 11.1* 11.2*  HCT 29.3* 38.8 33.6* 34.5* 34.7*  MCV 83.6 82.1 80.8 79.9* 79.4*  PLT 190 148* 152 147* 146*    Cardiac Enzymes:  Recent Labs Lab 04/13/15 0625 04/13/15 1130 04/13/15 1718 04/13/15 2157 04/14/15 0504  TROPONINI 0.04* 0.03 <0.03 <0.03 0.03    BNP: Invalid input(s): POCBNP  CBG:  Recent Labs Lab 04/13/15 1636 04/13/15 1950 04/14/15 0027 04/14/15 0405 04/14/15 0724  GLUCAP 115* 115* 101* 116* 131*    Microbiology: Results for orders placed or performed during the hospital encounter of 04/13/15  MRSA PCR Screening     Status: Abnormal   Collection Time: 04/13/15 11:30 AM  Result Value Ref Range Status   MRSA by PCR POSITIVE (A) NEGATIVE Final    Comment: [facility] CALLED CHRISTINA MILES AT 1740 04/13/15        The GeneXpert MRSA Assay (FDA approved for NASAL specimens only), is one component of a comprehensive MRSA colonization surveillance program. It is not intended to diagnose MRSA infection nor to guide or monitor treatment for MRSA  infections.   Culture, blood (routine x 2)     Status: None (Preliminary result)   Collection Time: 04/13/15 12:45 PM  Result Value Ref Range Status   Specimen Description BLOOD LEFT HAND  Final   Special Requests   Final    BOTTLES DRAWN AEROBIC AND ANAEROBIC  AER 2CC ANA 1CC   Culture  Setup Time   Final    GRAM NEGATIVE RODS AEROBIC BOTTLE ONLY CRITICAL RESULT CALLED TO, READ BACK BY AND VERIFIED WITH: Cicero Duck TAYLOR 04/14/2015 0515 [facility] CONFIRMED BY [facility]    Culture   Final    GRAM NEGATIVE RODS AEROBIC BOTTLE ONLY IDENTIFICATION TO FOLLOW    Report Status PENDING  Incomplete  Culture, blood (routine x 2)     Status: None  (Preliminary result)   Collection Time: 04/13/15  1:05 PM  Result Value Ref Range Status   Specimen Description BLOOD RIGHT HAND  Final   Special Requests BOTTLES DRAWN AEROBIC AND ANAEROBIC  4CC  Final   Culture  Setup Time   Final    GRAM NEGATIVE RODS ANAEROBIC BOTTLE ONLY CRITICAL RESULT CALLED TO, READ BACK BY AND VERIFIED WITH: ERIKA TAYLOR 04/14/2015 0445 [facility]    Culture   Final    GRAM NEGATIVE RODS ANAEROBIC BOTTLE ONLY IDENTIFICATION TO FOLLOW CONFIRMED BY [facility]    Report Status PENDING  Incomplete    Coagulation Studies:  Recent Labs  04/13/15 0625 04/13/15 1130 04/13/15 1718 04/13/15 2344 04/14/15 0504  LABPROT >120.0* 84.1* 22.2* 20.7* 20.1*  INR >12.00* 10.89* 1.93 1.76 1.69    Urinalysis:  Recent Labs  04/13/15 1408  COLORURINE AMBER*  LABSPEC 1.019  PHURINE 5.0  GLUCOSEU NEGATIVE  HGBUR 2+*  BILIRUBINUR NEGATIVE  KETONESUR NEGATIVE  PROTEINUR 100*  NITRITE NEGATIVE  LEUKOCYTESUR NEGATIVE      Imaging: Dg Chest 1 View  04/13/2015   CLINICAL DATA:  Line placement  EXAM: CHEST  1 VIEW  COMPARISON:  04/13/2015  FINDINGS: Endotracheal tube has been advanced with the tip now 3 cm above the carina. Right central line is in place with the tip at the cavoatrial junction. No pneumothorax. There is cardiomegaly with vascular congestion. Low lung volumes with bibasilar opacities, likely atelectasis.  IMPRESSION: Right central line tip at the cavoatrial junction.  No pneumothorax.  Cardiomegaly with vascular congestion.  Low lung volumes with bibasilar atelectasis.   Electronically Signed   By: Charlett Nose M.D.   On: 04/13/2015 11:40   Dg Abd 1 View  04/13/2015   CLINICAL DATA:  Gastric bypass history. Abdominal distention. GI bleed.  EXAM: ABDOMEN - 1 VIEW  COMPARISON:  05/03/2007  FINDINGS: Nonobstructive bowel gas pattern. No organomegaly or suspicious calcification definitively visualized. Study somewhat limited by body habitus and portable nature of the  study. No visible free air.  IMPRESSION: No evidence of bowel obstruction or free air.  Limited study.   Electronically Signed   By: Charlett Nose M.D.   On: 04/13/2015 11:38   Dg Chest Port 1 View  04/14/2015   CLINICAL DATA:  Respiratory failure  EXAM: PORTABLE CHEST - 1 VIEW  COMPARISON:  April 13, 2015  FINDINGS: The heart size and mediastinal contours are stable. The heart size enlarged. Aorta is tortuous. Endotracheal tube is identified distal tip 4.5 cm from carina. Right central venous line is identified with distal tip probably in the right atrium. There is pulmonary edema. This probably a small pleural effusion. Patchy opacities identified in bilateral medial lung bases  probably due to atelectasis. The visualized skeletal structures are stable.  IMPRESSION: Cardiomegaly with pulmonary edema.   Electronically Signed   By: Sherian Rein M.D.   On: 04/14/2015 07:29   Dg Chest Portable 1 View  04/13/2015   CLINICAL DATA:  CP are in progress.  EXAM: PORTABLE CHEST - 1 VIEW  COMPARISON:  To over 1920 15  FINDINGS: Endotracheal tube is identified distal tip 5.7 cm from carina. The heart size is enlarged. Mediastinal contour is normal. The aorta is tortuous. There is pulmonary edema. There is no pleural effusion. No acute abnormality is identified in the visualized bones.  IMPRESSION: Endotracheal tube 5.7 cm from carina. There is no pneumothorax. Pulmonary edema.   Electronically Signed   By: Sherian Rein M.D.   On: 04/13/2015 07:18     Medications:   . amiodarone Stopped (04/13/15 1657)  . dextrose 50 mL/hr at 04/14/15 0800  . norepinephrine (LEVOPHED) Adult infusion 30 mcg/min (04/14/15 0516)  . pantoprozole (PROTONIX) infusion 8 mg/hr (04/13/15 1658)   . sodium chloride  10 mL/hr Intravenous Once  . antiseptic oral rinse  7 mL Mouth Rinse QID  . chlorhexidine gluconate  15 mL Mouth Rinse BID  . Chlorhexidine Gluconate Cloth  6 each Topical Q0600  . Influenza vac split quadrivalent PF  0.5  mL Intramuscular Tomorrow-1000  . insulin aspart  0-20 Units Subcutaneous 6 times per day  . mupirocin ointment  1 application Nasal BID  . [START ON 04/16/2015] pantoprazole (PROTONIX) IV  40 mg Intravenous Q12H  . phytonadione (VITAMIN K) IV  10 mg Intravenous Daily  . piperacillin-tazobactam (ZOSYN)  IV  3.375 g Intravenous Q12H  . pneumococcal 23 valent vaccine  0.5 mL Intramuscular Tomorrow-1000  . sodium chloride  3 mL Intravenous Q12H   fentaNYL (SUBLIMAZE) injection, LORazepam, sodium chloride  Assessment/ Plan:  64 y.o. female with a PMHX of morbid obesity, DVT, PE, IVC filter, GERD, recent gastric bypass procedure on 8/22 at [facility], was admitted on 04/13/2015 with cardiac arrest, acute renal failure, severe coagulopathy.  1. Acute renal failure due to ATN . Baseline creatinine 0.6 on August 22 -renal function improved as has UOP.   Therefore no urgent indication for HD at present though this is still a possibility during this admission. Avoid nephrotoxins as possible for now.  Will follow progress closely.  2. Acute respiratory failure - remains on the vent, would continue vent support at this time.  3. Septic shock: gram negative rods growing in the blood, on pressors, continue zosyn at this time.  4. Recent LAPAROSCOPIC BILIOPANCREATIC DIVERSION, DUODENAL SWITCH AND HIATAL HERNIA REPAIR    LOS: 1 Tatym Schermer 8/29/20169:30 AM

## 2015-04-14 NOTE — Progress Notes (Signed)
Social consult. All results reviewed. I appreciate the care rendered by all specialists. The prognosis is poor. The drain smell may indicate ischemic bowel s/p arrest. She has no neuro function that I can detect today. Plan EEG/CT head tomorrow. I will be by tomorrow to see again. I spoke with family and they are hopeful but understanding. No surgical intervention indicated.

## 2015-04-14 NOTE — Consult Note (Signed)
CC: Foul smelling output from JP  HPI: Paige [name] is a pleasant 64 yo  F with recent duodenal switch at [facility] on 8/22 (Dr. Smitty Cords), PE and DVT on coumadin who presented to ER in cardiac arrest.  Was down for approx 10 minutes.  Currently unresponsive.  Consulted for foul drainage output from surgical drain.  WBC elevated to 17.  H/H stable, initially INR supratherapeutic and required 4 unit transfusion.  Drainage from drain became significant once patient stool prior to arrest.  On 30 meq levophed.  Unable to obtain ROS.  History obtained from daughter who is bedside.    Active Ambulatory Problems    Diagnosis Date Noted  . History of anticoagulant therapy 12/30/2014  . Arthritis 12/30/2014  . Deep vein thrombosis of lower extremity 12/30/2014  . BP (high blood pressure) 12/30/2014  . PE (pulmonary embolism) 12/30/2014  . Extreme obesity 12/30/2014   Resolved Ambulatory Problems    Diagnosis Date Noted  . No Resolved Ambulatory Problems   Past Medical History  Diagnosis Date  . Blood clot in vein 01/2012  . Heart disease   . Hemorrhoids   . Hypertension   . GERD (gastroesophageal reflux disease)   . Peptic ulcer disease    Past Surgical History  Procedure Laterality Date  . Esophagogastroduodenoscopy N/A 01/08/2015    Procedure: ESOPHAGOGASTRODUODENOSCOPY (EGD);  Surgeon: Scot Jun, MD;  Location: [facility] ENDOSCOPY;  Service: Endoscopy;  Laterality: N/A;  . Laparoscopy      Bariatric Sx 8/22  . Other surgical history      IVC Filter     Medication List    ASK your doctor about these medications        amLODipine 5 MG tablet  Commonly known as:  NORVASC  Take 5 mg by mouth daily.     aspirin 81 MG tablet  Take 81 mg by mouth 2 (two) times a week.     carvedilol 25 MG tablet  Commonly known as:  COREG  Take 50 mg by mouth 2 (two) times daily with a meal.     cholecalciferol 400 UNITS Tabs tablet  Commonly known as:  VITAMIN D  Take 800 Units by mouth every morning.      enoxaparin 120 MG/0.8ML injection  Commonly known as:  LOVENOX  Inject 0.8 mLs (120 mg total) into the skin every 12 (twelve) hours.     HYCET 7.5-325 mg/15 ml solution  Generic drug:  HYDROcodone-acetaminophen  Take 15 mLs by mouth every 4 (four) hours as needed. For up to 7 days for pain.     hydrALAZINE 50 MG tablet  Commonly known as:  APRESOLINE  Take 50 mg by mouth 2 (two) times daily.     omeprazole 40 MG capsule  Commonly known as:  PRILOSEC  Take 40 mg by mouth every morning.     warfarin 3 MG tablet  Commonly known as:  COUMADIN  Take 9 mg by mouth every evening. Take with evening meal.     ZOFRAN ODT 4 MG disintegrating tablet  Generic drug:  ondansetron  Take 4 mg by mouth every 4 (four) hours as needed. For up to 7 days       Allergies  Allergen Reactions  . Lisinopril Swelling    This medication makes patient lips swell.   Social History   Social History  . Marital Status: Single    Spouse Name: N/A  . Number of Children: N/A  . Years of Education: N/A  Occupational History  . Not on file.   Social History Main Topics  . Smoking status: Never Smoker   . Smokeless tobacco: Never Used  . Alcohol Use: No  . Drug Use: No  . Sexual Activity: Not on file   Other Topics Concern  . Not on file   Social History Narrative   Family History  Problem Relation Age of Onset  . CAD Mother   . Hypertension Mother   . CAD Father    ROS: Unable to be obtained  Blood pressure 100/73, pulse 98, temperature 99 F (37.2 C), temperature source Core (Comment), resp. rate 21, height  (1.6 m), weight 381 lb 6.3 oz (173 kg), SpO2 97 %. GEN: NAD, unresponsive FACE: no obvious facial trauma, normal external nose, normal external ears EYES: no scleral icterus, no conjunctivitis HEAD: normocephalic atraumatic CV: RRR, no MRG RESP: moving air well, lungs clear ABD: soft, nontender, nondistended, JP with foul smelling, dark red blood  Imaging: No new  imaging on abdomen KUB at admission: No free air  Labs: Personally reviewed,   WBC 17.5 Hgb 11.2  Cr. 3.18  A/P 64 yo s/p cardiac arrest.  On vasopressors, nonresponsive.  Purulence could be due to infected hematoma vs leak.  Patient critically ill and to undergo head CT.  Overall prognosis poor.  Would await stabilization prior to any imaging and per daughter, Dr. Smitty Cords is aware of patient condition.  Would recommend management per his recommendations. Will request records from [facility].  Would recommend abx for whatever infectious process is.

## 2015-04-14 NOTE — Consult Note (Signed)
Reason for Consult: altered mental status Referring Physician:  Dr. Twanna Hy ANDREIKA Paige [name] is an 64 y.o. female.  HPI: seen at request of Dr. Volanda Napoleon for altered mental status;  64 yo RHD F presents to [facility] after feeling bad and having respiratory distress.  In ambulance, pt coded which lasted from 10-15 minutes with 5 rounds of epinephrine.  Upon arrival here, she has been poorly responsive so Neurology was called to evaluate.  She is not on any sedation and is not responding to pain.  Past Medical History  Diagnosis Date  . Blood clot in vein 01/2012  . Arthritis   . Heart disease   . Hemorrhoids   . Hypertension   . GERD (gastroesophageal reflux disease)   . Peptic ulcer disease     Past Surgical History  Procedure Laterality Date  . Esophagogastroduodenoscopy N/A 01/08/2015    Procedure: ESOPHAGOGASTRODUODENOSCOPY (EGD);  Surgeon: Manya Silvas, MD;  Location: [facility] ENDOSCOPY;  Service: Endoscopy;  Laterality: N/A;  . Laparoscopy      Bariatric Sx 8/22  . Other surgical history      IVC Filter    Family History  Problem Relation Age of Onset  . CAD Mother   . Hypertension Mother   . CAD Father     Social History:  reports that she has never smoked. She has never used smokeless tobacco. She reports that she does not drink alcohol or use illicit drugs.  Allergies:  Allergies  Allergen Reactions  . Lisinopril Swelling    This medication makes patient lips swell.    Medications: personally reviewed by me  Results for orders placed or performed during the hospital encounter of 04/13/15 (from the past 48 hour(s))  CBC     Status: Abnormal   Collection Time: 04/13/15  6:25 AM  Result Value Ref Range   WBC 7.8 3.6 - 11.0 K/uL   RBC 3.50 (L) 3.80 - 5.20 MIL/uL   Hemoglobin 9.1 (L) 12.0 - 16.0 g/dL   HCT 29.3 (L) 35.0 - 47.0 %   MCV 83.6 80.0 - 100.0 fL   MCH 26.0 26.0 - 34.0 pg   MCHC 31.1 (L) 32.0 - 36.0 g/dL   RDW 19.4 (H) 11.5 - 14.5 %   Platelets 190 150 - 440  K/uL  Basic metabolic panel     Status: Abnormal   Collection Time: 04/13/15  6:25 AM  Result Value Ref Range   Sodium 145 135 - 145 mmol/L   Potassium 4.7 3.5 - 5.1 mmol/L   Chloride 108 101 - 111 mmol/L   CO2 21 (L) 22 - 32 mmol/L   Glucose, Bld 189 (H) 65 - 99 mg/dL   BUN 78 (H) 6 - 20 mg/dL   Creatinine, Ser 4.62 (H) 0.44 - 1.00 mg/dL   Calcium 7.9 (L) 8.9 - 10.3 mg/dL   GFR calc non Af Amer 9 (L) >60 mL/min   GFR calc Af Amer 11 (L) >60 mL/min    Comment: (NOTE) The eGFR has been calculated using the CKD EPI equation. This calculation has not been validated in all clinical situations. eGFR's persistently <60 mL/min signify possible Chronic Kidney Disease.    Anion gap 16 (H) 5 - 15  APTT     Status: Abnormal   Collection Time: 04/13/15  6:25 AM  Result Value Ref Range   aPTT 144 (H) 24 - 36 seconds    Comment:        IF BASELINE aPTT IS ELEVATED,  SUGGEST PATIENT RISK ASSESSMENT BE USED TO DETERMINE APPROPRIATE ANTICOAGULANT THERAPY.   Magnesium     Status: Abnormal   Collection Time: 04/13/15  6:25 AM  Result Value Ref Range   Magnesium 2.7 (H) 1.7 - 2.4 mg/dL  Troponin I     Status: Abnormal   Collection Time: 04/13/15  6:25 AM  Result Value Ref Range   Troponin I 0.04 (H) <0.031 ng/mL    Comment: READ BACK AND VERIFIED WITH ALICIA GRAINGER AT [phone] ON 04/13/15.Marland KitchenMarland KitchenMulberry        PERSISTENTLY INCREASED TROPONIN VALUES IN THE RANGE OF 0.04-0.49 ng/mL CAN BE SEEN IN:       -UNSTABLE ANGINA       -CONGESTIVE HEART FAILURE       -MYOCARDITIS       -CHEST TRAUMA       -ARRYHTHMIAS       -LATE PRESENTING MYOCARDIAL INFARCTION       -COPD   CLINICAL FOLLOW-UP RECOMMENDED.   Protime-INR     Status: Abnormal   Collection Time: 04/13/15  6:25 AM  Result Value Ref Range   Prothrombin Time >120.0 (H) 11.4 - 15.0 seconds   INR >12.00 (HH)     Comment: CRITICAL RESULT CALLED TO, READ BACK BY AND VERIFIED WITH: CALLED TO [facility] MOORE @ [phone] ON 04/13/2015 BY CAF   Blood gas,  arterial     Status: Abnormal   Collection Time: 04/13/15  6:39 AM  Result Value Ref Range   FIO2 1.00    Mode ASSIST CONTROL    VT 450 mL   Peep/cpap 5.0 cm H20   pH, Arterial 7.29 (L) 7.350 - 7.450   pCO2 arterial 37 32.0 - 48.0 mmHg   pO2, Arterial 174 (H) 83.0 - 108.0 mmHg   Bicarbonate 17.8 (L) 21.0 - 28.0 mEq/L   Acid-base deficit 8.1 (H) 0.0 - 2.0 mmol/L   O2 Saturation 99.4 %   Patient temperature 37.0    Collection site LEFT RADIAL    Drawn by DT    Sample type ARTERIAL DRAW    Allens test (pass/fail) PASS PASS   Mechanical Rate 16   Lactic acid, plasma     Status: Abnormal   Collection Time: 04/13/15  6:42 AM  Result Value Ref Range   Lactic Acid, Venous 6.3 (HH) 0.5 - 2.0 mmol/L    Comment: CRITICAL RESULT CALLED TO, READ BACK BY AND VERIFIED WITH ON 04/13/15 AT 730 TO DR WEBSTER BY TB.   Type and screen     Status: None   Collection Time: 04/13/15  6:42 AM  Result Value Ref Range   ABO/RH(D) AB POS    Antibody Screen NEG    Sample Expiration 04/16/2015    Unit Number [id number]    Blood Component Type RED CELLS,LR    Unit division 00    Status of Unit ISSUED,FINAL    Transfusion Status OK TO TRANSFUSE    Crossmatch Result Compatible    Unit Number [id number]    Blood Component Type RED CELLS,LR    Unit division 00    Status of Unit ISSUED,FINAL    Transfusion Status OK TO TRANSFUSE    Crossmatch Result Compatible    Unit Number [id number]    Blood Component Type RBC, LR IRR    Unit division 00    Status of Unit ISSUED,FINAL    Transfusion Status OK TO TRANSFUSE    Crossmatch Result Compatible    Unit Number [id number]  Blood Component Type RBC, LR IRR    Unit division 00    Status of Unit ISSUED,FINAL    Transfusion Status OK TO TRANSFUSE    Crossmatch Result Compatible   ABO/Rh     Status: None   Collection Time: 04/13/15  6:42 AM  Result Value Ref Range   ABO/RH(D) AB POS   pH, gastric fluid (gastroccult card)     Status: None    Collection Time: 04/13/15  7:10 AM  Result Value Ref Range   pH, Gastric 3   Prepare RBC     Status: None   Collection Time: 04/13/15  7:51 AM  Result Value Ref Range   Order Confirmation ORDER PROCESSED BY BLOOD BANK   Prepare fresh frozen plasma     Status: None   Collection Time: 04/13/15  8:11 AM  Result Value Ref Range   Unit Number [id number]    Blood Component Type THAWED PLASMA    Unit division 00    Status of Unit ISSUED,FINAL    Transfusion Status OK TO TRANSFUSE    Unit Number [id number]    Blood Component Type THAWED PLASMA    Unit division 00    Status of Unit ISSUED,FINAL    Transfusion Status OK TO TRANSFUSE    Unit Number [id number]    Blood Component Type THAWED PLASMA    Unit division 00    Status of Unit ISSUED,FINAL    Transfusion Status OK TO TRANSFUSE   Prepare RBC     Status: None   Collection Time: 04/13/15  9:00 AM  Result Value Ref Range   Order Confirmation ORDER PROCESSED BY BLOOD BANK   Prepare fresh frozen plasma     Status: None   Collection Time: 04/13/15 10:39 AM  Result Value Ref Range   Unit Number [id number]    Blood Component Type THAWED PLASMA    Unit division 00    Status of Unit ISSUED,FINAL    Transfusion Status OK TO TRANSFUSE   Glucose, capillary     Status: Abnormal   Collection Time: 04/13/15 10:55 AM  Result Value Ref Range   Glucose-Capillary 141 (H) 65 - 99 mg/dL  Lactic acid, plasma     Status: None   Collection Time: 04/13/15 11:30 AM  Result Value Ref Range   Lactic Acid, Venous 1.7 0.5 - 2.0 mmol/L  Troponin I (q 6hr x 3)     Status: None   Collection Time: 04/13/15 11:30 AM  Result Value Ref Range   Troponin I 0.03 <0.031 ng/mL    Comment:        NO INDICATION OF MYOCARDIAL INJURY.   Comprehensive metabolic panel     Status: Abnormal   Collection Time: 04/13/15 11:30 AM  Result Value Ref Range   Sodium 138 135 - 145 mmol/L   Potassium 4.4 3.5 - 5.1 mmol/L   Chloride 108 101 - 111 mmol/L   CO2  20 (L) 22 - 32 mmol/L   Glucose, Bld 164 (H) 65 - 99 mg/dL   BUN 72 (H) 6 - 20 mg/dL   Creatinine, Ser 3.68 (H) 0.44 - 1.00 mg/dL   Calcium 6.9 (L) 8.9 - 10.3 mg/dL   Total Protein 5.6 (L) 6.5 - 8.1 g/dL   Albumin 1.8 (L) 3.5 - 5.0 g/dL   AST 179 (H) 15 - 41 U/L   ALT 90 (H) 14 - 54 U/L   Alkaline Phosphatase 64 38 - 126 U/L   Total Bilirubin 1.8 (  H) 0.3 - 1.2 mg/dL   GFR calc non Af Amer 12 (L) >60 mL/min   GFR calc Af Amer 14 (L) >60 mL/min    Comment: (NOTE) The eGFR has been calculated using the CKD EPI equation. This calculation has not been validated in all clinical situations. eGFR's persistently <60 mL/min signify possible Chronic Kidney Disease.    Anion gap 10 5 - 15  CBC     Status: Abnormal   Collection Time: 04/13/15 11:30 AM  Result Value Ref Range   WBC 10.3 3.6 - 11.0 K/uL   RBC 4.73 3.80 - 5.20 MIL/uL   Hemoglobin 12.5 12.0 - 16.0 g/dL    Comment: RESULT REPEATED AND VERIFIED DELTA CHECK NOTED    HCT 38.8 35.0 - 47.0 %   MCV 82.1 80.0 - 100.0 fL   MCH 26.4 26.0 - 34.0 pg   MCHC 32.1 32.0 - 36.0 g/dL   RDW 18.8 (H) 11.5 - 14.5 %   Platelets 148 (L) 150 - 440 K/uL  Protime-INR     Status: Abnormal   Collection Time: 04/13/15 11:30 AM  Result Value Ref Range   Prothrombin Time 84.1 (H) 11.4 - 15.0 seconds   INR 10.89 (HH)     Comment: CRITICAL RESULT CALLED TO, READ BACK BY AND VERIFIED WITH: CALLED TO CHRISTINA MILES @ 4196 ON 04/13/2015 BY CAF ALL CONFIRMED BY REPEAT TESTING   APTT     Status: Abnormal   Collection Time: 04/13/15 11:30 AM  Result Value Ref Range   aPTT 89 (H) 24 - 36 seconds  MRSA PCR Screening     Status: Abnormal   Collection Time: 04/13/15 11:30 AM  Result Value Ref Range   MRSA by PCR POSITIVE (A) NEGATIVE    Comment: [facility] CALLED CHRISTINA MILES AT 1740 04/13/15        The GeneXpert MRSA Assay (FDA approved for NASAL specimens only), is one component of a comprehensive MRSA colonization surveillance program. It is  not intended to diagnose MRSA infection nor to guide or monitor treatment for MRSA infections.   Culture, blood (routine x 2)     Status: None (Preliminary result)   Collection Time: 04/13/15 12:45 PM  Result Value Ref Range   Specimen Description BLOOD LEFT HAND    Special Requests      BOTTLES DRAWN AEROBIC AND ANAEROBIC  AER 2CC ANA 1CC   Culture  Setup Time      GRAM NEGATIVE RODS AEROBIC BOTTLE ONLY CRITICAL RESULT CALLED TO, READ BACK BY AND VERIFIED WITH: ERIKA TAYLOR 04/14/2015 0515 [place] CONFIRMED BY [facility]    Culture      GRAM NEGATIVE RODS AEROBIC BOTTLE ONLY IDENTIFICATION TO FOLLOW    Report Status PENDING   Culture, blood (routine x 2)     Status: None (Preliminary result)   Collection Time: 04/13/15  1:05 PM  Result Value Ref Range   Specimen Description BLOOD RIGHT HAND    Special Requests BOTTLES DRAWN AEROBIC AND ANAEROBIC  4CC    Culture  Setup Time      GRAM NEGATIVE RODS ANAEROBIC BOTTLE ONLY CRITICAL RESULT CALLED TO, READ BACK BY AND VERIFIED WITH: ERIKA TAYLOR 04/14/2015 0445 [place]    Culture      GRAM NEGATIVE RODS ANAEROBIC BOTTLE ONLY IDENTIFICATION TO FOLLOW CONFIRMED BY [facility]    Report Status PENDING   Blood gas, arterial     Status: Abnormal   Collection Time: 04/13/15  2:00 PM  Result Value Ref Range  FIO2 1.00    Delivery systems VENTILATOR    Mode PRESSURE REGULATED VOLUME CONTROL    VT 450 mL   LHR 16 resp/min   Peep/cpap 5.0 cm H20   pH, Arterial 7.35 7.350 - 7.450   pCO2 arterial 34 32.0 - 48.0 mmHg   pO2, Arterial 209 (H) 83.0 - 108.0 mmHg   Bicarbonate 18.8 (L) 21.0 - 28.0 mEq/L   Acid-base deficit 5.9 (H) 0.0 - 2.0 mmol/L   O2 Saturation 99.7 %   Patient temperature 37.0    Collection site A-LINE    Sample type ARTERIAL DRAW   Urinalysis complete, with microscopic ([facility] only)     Status: Abnormal   Collection Time: 04/13/15  2:08 PM  Result Value Ref Range   Color, Urine AMBER (A) YELLOW   APPearance TURBID (A) CLEAR    Glucose, UA NEGATIVE NEGATIVE mg/dL   Bilirubin Urine NEGATIVE NEGATIVE   Ketones, ur NEGATIVE NEGATIVE mg/dL   Specific Gravity, Urine 1.019 1.005 - 1.030   Hgb urine dipstick 2+ (A) NEGATIVE   pH 5.0 5.0 - 8.0   Protein, ur 100 (A) NEGATIVE mg/dL   Nitrite NEGATIVE NEGATIVE   Leukocytes, UA NEGATIVE NEGATIVE   RBC / HPF 6-30 0 - 5 RBC/hpf   WBC, UA TOO NUMEROUS TO COUNT 0 - 5 WBC/hpf   Bacteria, UA NONE SEEN NONE SEEN   Squamous Epithelial / LPF 6-30 (A) NONE SEEN   WBC Clumps PRESENT    Mucous PRESENT    Budding Yeast PRESENT    Amorphous Crystal PRESENT   Glucose, capillary     Status: Abnormal   Collection Time: 04/13/15  4:36 PM  Result Value Ref Range   Glucose-Capillary 115 (H) 65 - 99 mg/dL  Troponin I (q 6hr x 3)     Status: None   Collection Time: 04/13/15  5:18 PM  Result Value Ref Range   Troponin I <0.03 <0.031 ng/mL    Comment:        NO INDICATION OF MYOCARDIAL INJURY.   CBC     Status: Abnormal   Collection Time: 04/13/15  5:18 PM  Result Value Ref Range   WBC 11.2 (H) 3.6 - 11.0 K/uL   RBC 4.16 3.80 - 5.20 MIL/uL   Hemoglobin 11.0 (L) 12.0 - 16.0 g/dL   HCT 33.6 (L) 35.0 - 47.0 %   MCV 80.8 80.0 - 100.0 fL   MCH 26.5 26.0 - 34.0 pg   MCHC 32.8 32.0 - 36.0 g/dL   RDW 18.5 (H) 11.5 - 14.5 %   Platelets 152 150 - 440 K/uL  Protime-INR     Status: Abnormal   Collection Time: 04/13/15  5:18 PM  Result Value Ref Range   Prothrombin Time 22.2 (H) 11.4 - 15.0 seconds    Comment: ALL COAGS VERIFIED BY REPEAT TESTING   INR 1.93   APTT     Status: Abnormal   Collection Time: 04/13/15  5:18 PM  Result Value Ref Range   aPTT 45 (H) 24 - 36 seconds    Comment:        IF BASELINE aPTT IS ELEVATED, SUGGEST PATIENT RISK ASSESSMENT BE USED TO DETERMINE APPROPRIATE ANTICOAGULANT THERAPY.   Glucose, capillary     Status: Abnormal   Collection Time: 04/13/15  7:50 PM  Result Value Ref Range   Glucose-Capillary 115 (H) 65 - 99 mg/dL  Troponin I (q 6hr x 3)      Status: None   Collection  Time: 04/13/15  9:57 PM  Result Value Ref Range   Troponin I <0.03 <0.031 ng/mL    Comment:        NO INDICATION OF MYOCARDIAL INJURY.   Basic metabolic panel     Status: Abnormal   Collection Time: 04/13/15  9:57 PM  Result Value Ref Range   Sodium 141 135 - 145 mmol/L   Potassium 3.7 3.5 - 5.1 mmol/L   Chloride 110 101 - 111 mmol/L   CO2 21 (L) 22 - 32 mmol/L   Glucose, Bld 141 (H) 65 - 99 mg/dL   BUN 75 (H) 6 - 20 mg/dL   Creatinine, Ser 3.48 (H) 0.44 - 1.00 mg/dL   Calcium 7.5 (L) 8.9 - 10.3 mg/dL   GFR calc non Af Amer 13 (L) >60 mL/min   GFR calc Af Amer 15 (L) >60 mL/min    Comment: (NOTE) The eGFR has been calculated using the CKD EPI equation. This calculation has not been validated in all clinical situations. eGFR's persistently <60 mL/min signify possible Chronic Kidney Disease.    Anion gap 10 5 - 15  CBC     Status: Abnormal   Collection Time: 04/13/15 11:44 PM  Result Value Ref Range   WBC 16.1 (H) 3.6 - 11.0 K/uL   RBC 4.31 3.80 - 5.20 MIL/uL   Hemoglobin 11.1 (L) 12.0 - 16.0 g/dL   HCT 34.5 (L) 35.0 - 47.0 %   MCV 79.9 (L) 80.0 - 100.0 fL   MCH 25.7 (L) 26.0 - 34.0 pg   MCHC 32.2 32.0 - 36.0 g/dL   RDW 19.0 (H) 11.5 - 14.5 %   Platelets 147 (L) 150 - 440 K/uL  Protime-INR     Status: Abnormal   Collection Time: 04/13/15 11:44 PM  Result Value Ref Range   Prothrombin Time 20.7 (H) 11.4 - 15.0 seconds   INR 1.76   APTT     Status: Abnormal   Collection Time: 04/13/15 11:44 PM  Result Value Ref Range   aPTT 42 (H) 24 - 36 seconds    Comment:        IF BASELINE aPTT IS ELEVATED, SUGGEST PATIENT RISK ASSESSMENT BE USED TO DETERMINE APPROPRIATE ANTICOAGULANT THERAPY.   Glucose, capillary     Status: Abnormal   Collection Time: 04/14/15 12:27 AM  Result Value Ref Range   Glucose-Capillary 101 (H) 65 - 99 mg/dL  Glucose, capillary     Status: Abnormal   Collection Time: 04/14/15  4:05 AM  Result Value Ref Range    Glucose-Capillary 116 (H) 65 - 99 mg/dL  CBC     Status: Abnormal   Collection Time: 04/14/15  5:04 AM  Result Value Ref Range   WBC 17.5 (H) 3.6 - 11.0 K/uL   RBC 4.37 3.80 - 5.20 MIL/uL   Hemoglobin 11.2 (L) 12.0 - 16.0 g/dL   HCT 34.7 (L) 35.0 - 47.0 %   MCV 79.4 (L) 80.0 - 100.0 fL   MCH 25.7 (L) 26.0 - 34.0 pg   MCHC 32.3 32.0 - 36.0 g/dL   RDW 19.1 (H) 11.5 - 14.5 %   Platelets 146 (L) 150 - 440 K/uL  Protime-INR     Status: Abnormal   Collection Time: 04/14/15  5:04 AM  Result Value Ref Range   Prothrombin Time 20.1 (H) 11.4 - 15.0 seconds   INR 1.69   APTT     Status: Abnormal   Collection Time: 04/14/15  5:04 AM  Result Value  Ref Range   aPTT 41 (H) 24 - 36 seconds    Comment:        IF BASELINE aPTT IS ELEVATED, SUGGEST PATIENT RISK ASSESSMENT BE USED TO DETERMINE APPROPRIATE ANTICOAGULANT THERAPY.   Comprehensive metabolic panel     Status: Abnormal   Collection Time: 04/14/15  5:04 AM  Result Value Ref Range   Sodium 142 135 - 145 mmol/L   Potassium 3.8 3.5 - 5.1 mmol/L   Chloride 110 101 - 111 mmol/L   CO2 21 (L) 22 - 32 mmol/L   Glucose, Bld 138 (H) 65 - 99 mg/dL   BUN 78 (H) 6 - 20 mg/dL   Creatinine, Ser 3.18 (H) 0.44 - 1.00 mg/dL   Calcium 7.7 (L) 8.9 - 10.3 mg/dL   Total Protein 5.9 (L) 6.5 - 8.1 g/dL   Albumin 2.0 (L) 3.5 - 5.0 g/dL   AST 85 (H) 15 - 41 U/L   ALT 62 (H) 14 - 54 U/L   Alkaline Phosphatase 53 38 - 126 U/L   Total Bilirubin 3.2 (H) 0.3 - 1.2 mg/dL   GFR calc non Af Amer 14 (L) >60 mL/min   GFR calc Af Amer 17 (L) >60 mL/min    Comment: (NOTE) The eGFR has been calculated using the CKD EPI equation. This calculation has not been validated in all clinical situations. eGFR's persistently <60 mL/min signify possible Chronic Kidney Disease.    Anion gap 11 5 - 15  Troponin I     Status: None   Collection Time: 04/14/15  5:04 AM  Result Value Ref Range   Troponin I 0.03 <0.031 ng/mL    Comment:        NO INDICATION OF MYOCARDIAL  INJURY.   Lactic acid, plasma     Status: None   Collection Time: 04/14/15  5:04 AM  Result Value Ref Range   Lactic Acid, Venous 1.8 0.5 - 2.0 mmol/L  Procalcitonin     Status: None   Collection Time: 04/14/15  5:04 AM  Result Value Ref Range   Procalcitonin 66.58 ng/mL  Fibrinogen     Status: Abnormal   Collection Time: 04/14/15  5:04 AM  Result Value Ref Range   Fibrinogen >800 (H) 210 - 470 mg/dL  Fibrin derivatives D-Dimer ([facility] only)     Status: Abnormal   Collection Time: 04/14/15  5:04 AM  Result Value Ref Range   Fibrin derivatives D-dimer ([facility]) 1206 (H) 0 - 499    Comment: <> Exclusion of Venous Thromboembolism (VTE) - OUTPATIENTS ONLY        (Emergency Department or [place])             0-499 ng/ml (FEU)  : With a low to intermediate pretest                                        probability for VTE this test result                                        excludes the diagnosis of VTE.           > 499 ng/ml (FEU)  : VTE not excluded.  Additional work up  for VTE is required.   <>  Testing on Inpatients and Evaluation of Disseminated Intravascular        Coagulation ([facility])             Reference Range:   0-499 ng/ml (FEU)   Fibrin Degradation Products ([facility] only)     Status: Abnormal   Collection Time: 04/14/15  5:04 AM  Result Value Ref Range   Fibrin Degradation Prod. <10 (A) <10 ug/mL  Blood gas, arterial     Status: Abnormal   Collection Time: 04/14/15  5:17 AM  Result Value Ref Range   FIO2 0.50    Delivery systems VENTILATOR    Mode PRESSURE REGULATED VOLUME CONTROL    VT 450 mL   Peep/cpap 5.0 cm H20   pH, Arterial 7.44 7.350 - 7.450   pCO2 arterial 30 (L) 32.0 - 48.0 mmHg   pO2, Arterial 92 83.0 - 108.0 mmHg   Bicarbonate 20.4 (L) 21.0 - 28.0 mEq/L   Acid-base deficit 2.7 (H) 0.0 - 2.0 mmol/L   O2 Saturation 97.4 %   Patient temperature 37.0    Collection site A-LINE    Sample type ARTERIAL DRAW    Mechanical Rate 16    Glucose, capillary     Status: Abnormal   Collection Time: 04/14/15  7:24 AM  Result Value Ref Range   Glucose-Capillary 131 (H) 65 - 99 mg/dL  CBC     Status: Abnormal   Collection Time: 04/14/15 12:57 PM  Result Value Ref Range   WBC 18.4 (H) 3.6 - 11.0 K/uL   RBC 3.96 3.80 - 5.20 MIL/uL   Hemoglobin 10.3 (L) 12.0 - 16.0 g/dL   HCT 31.5 (L) 35.0 - 47.0 %   MCV 79.5 (L) 80.0 - 100.0 fL   MCH 25.9 (L) 26.0 - 34.0 pg   MCHC 32.6 32.0 - 36.0 g/dL   RDW 18.6 (H) 11.5 - 14.5 %   Platelets 126 (L) 150 - 440 K/uL    Dg Chest 1 View  04/13/2015   CLINICAL DATA:  Line placement  EXAM: CHEST  1 VIEW  COMPARISON:  04/13/2015  FINDINGS: Endotracheal tube has been advanced with the tip now 3 cm above the carina. Right central line is in place with the tip at the cavoatrial junction. No pneumothorax. There is cardiomegaly with vascular congestion. Low lung volumes with bibasilar opacities, likely atelectasis.  IMPRESSION: Right central line tip at the cavoatrial junction.  No pneumothorax.  Cardiomegaly with vascular congestion.  Low lung volumes with bibasilar atelectasis.   Electronically Signed   By: Rolm Baptise M.D.   On: 04/13/2015 11:40   Dg Abd 1 View  04/13/2015   CLINICAL DATA:  Gastric bypass history. Abdominal distention. GI bleed.  EXAM: ABDOMEN - 1 VIEW  COMPARISON:  05/03/2007  FINDINGS: Nonobstructive bowel gas pattern. No organomegaly or suspicious calcification definitively visualized. Study somewhat limited by body habitus and portable nature of the study. No visible free air.  IMPRESSION: No evidence of bowel obstruction or free air.  Limited study.   Electronically Signed   By: Rolm Baptise M.D.   On: 04/13/2015 11:38   Ct Head Wo Contrast  04/14/2015   CLINICAL DATA:  Cardiac arrest yesterday with encephalopathy.  EXAM: CT HEAD WITHOUT CONTRAST  TECHNIQUE: Contiguous axial images were obtained from the base of the skull through the vertex without intravenous contrast.   COMPARISON:  None.  FINDINGS: Skull and Sinuses:Negative for fracture or destructive process.  Fluid  levels in the sphenoid sinuses, likely from intubation status.  Orbits: No acute abnormality.  Brain: Motion degraded imaging, limiting diagnostic sensitivity and specificity.  There is indistinct gray-white junction multiple levels, not entirely explained by motion. Deep gray nuclei are visible and symmetric. No hemorrhage, hydrocephalus, or shift.  IMPRESSION: 1. Limited motion degraded study. 2. Poor gray-white differentiation, possible cytotoxic edema from hypoxic ischemic injury. 3. No intracranial hemorrhage or herniation.   Electronically Signed   By: Monte Fantasia M.D.   On: 04/14/2015 12:03   Dg Chest Port 1 View  04/14/2015   CLINICAL DATA:  Respiratory failure  EXAM: PORTABLE CHEST - 1 VIEW  COMPARISON:  April 13, 2015  FINDINGS: The heart size and mediastinal contours are stable. The heart size enlarged. Aorta is tortuous. Endotracheal tube is identified distal tip 4.5 cm from carina. Right central venous line is identified with distal tip probably in the right atrium. There is pulmonary edema. This probably a small pleural effusion. Patchy opacities identified in bilateral medial lung bases probably due to atelectasis. The visualized skeletal structures are stable.  IMPRESSION: Cardiomegaly with pulmonary edema.   Electronically Signed   By: Abelardo Diesel M.D.   On: 04/14/2015 07:29   Dg Chest Portable 1 View  04/13/2015   CLINICAL DATA:  CP are in progress.  EXAM: PORTABLE CHEST - 1 VIEW  COMPARISON:  To over 1920 15  FINDINGS: Endotracheal tube is identified distal tip 5.7 cm from carina. The heart size is enlarged. Mediastinal contour is normal. The aorta is tortuous. There is pulmonary edema. There is no pleural effusion. No acute abnormality is identified in the visualized bones.  IMPRESSION: Endotracheal tube 5.7 cm from carina. There is no pneumothorax. Pulmonary edema.   Electronically  Signed   By: Abelardo Diesel M.D.   On: 04/13/2015 07:18    Review of Systems  Unable to perform ROS: acuity of condition   Blood pressure 100/73, pulse 98, temperature 99 F (37.2 C), temperature source Core (Comment), resp. rate 21, height 5' 3"  (1.6 m), weight 173 kg (381 lb 6.3 oz), SpO2 97 %. Physical Exam  Nursing note and vitals reviewed. Constitutional: She appears well-developed and well-nourished. She appears distressed.  HENT:  Head: Normocephalic and atraumatic.  Nose: Nose normal.  Mouth/Throat: Oropharynx is clear and moist.  Eyes: Conjunctivae and EOM are normal. Pupils are equal, round, and reactive to light. No scleral icterus.  Neck: Normal range of motion. Neck supple.  Cardiovascular: Normal rate, regular rhythm, normal heart sounds and intact distal pulses.   No murmur heard. Respiratory: Breath sounds normal. She is in respiratory distress. She has no wheezes.  GI: Soft. Bowel sounds are normal. She exhibits no distension. There is no tenderness.  Musculoskeletal: Normal range of motion. She exhibits no edema.  Neurological: She is unresponsive. No cranial nerve deficit or sensory deficit. She exhibits normal muscle tone. GCS eye subscore is 1. GCS verbal subscore is 1. GCS motor subscore is 2.  Reflex Scores:      Tricep reflexes are 0 on the right side and 0 on the left side.      Bicep reflexes are 0 on the right side and 0 on the left side.      Brachioradialis reflexes are 0 on the right side and 0 on the left side.      Patellar reflexes are 0 on the right side and 0 on the left side.      Achilles reflexes are 0  on the right side and 0 on the left side. Skin: Skin is warm and dry. She is not diaphoretic.   CT of head personally reviewed by and shows severe artifact  Assessment/Plan: 1.  Encephalopathy-  Unusual for pt to have such a poor exam due to anoxic injury lasting only 10-15;  This may be due to other medical problems such as renal dysfunction and  infection.  Pt does have some slight R eye deviation so cant r/o nonconvulsive status as well. -  EEG in am -  Repeat CT of head -  Start Vimpat 84m BID for possible seizures -  Start thiamine 1068mIV daily -  Check ESR, ammonia, TSH, B12/folate -  Continue pressors per CC -  Avoid sedating medications if possible -  Discussed with family that I am unable to determine prognosis today -  Will follow with you  Valori Hollenkamp 04/14/2015, 2:25 PM

## 2015-04-14 NOTE — Consult Note (Signed)
Palliative Medicine Inpatient Consult Note   Name: Paige [name] Date: 04/14/2015 MRN: [id number]  DOB: 03-26-1951  Referring Physician: Gale Journey, MD  Palliative Care consult requested for this 64 y.o. female for goals of medical therapy in patient with apparent anoxic brain injury post CPR.  Today's conversations, events, and plan: 1.  Code status is changed to limited from full after talking in person with pts daughter and son-in-law. 2.  Family was able to voice concerns that pt should not 'suffer'. 3.  Family was informed that we will have more to discuss today after EEG results are known.  I mentioned terminal extubation.  4.  I spoke with Dr. Smitty Cords, bariatric surgeon who saw pt this evening. He felt she may have ischemic bowel with infection.    REVIEW OF SYSTEMS:  Patient is not able to provide ROS --unresponsive and intubaed  SPIRITUAL SUPPORT SYSTEM: She has one very loving daughter and son-in-law. .  SOCIAL HISTORY:  reports that she has never smoked. She has never used smokeless tobacco. She reports that she does not drink alcohol or use illicit drugs.  LEGAL DOCUMENTS:  None  CODE STATUS: Limited code as of now.  Daughter agrees to continue current life supportive interventions until more is known from EEG results and neurologist's input.  She does not want Compressions, cardioversions, or ACLS type meds to be used if pt has another cardiac arrest.   PAST MEDICAL HISTORY: Past Medical History  Diagnosis Date  . Blood clot in vein 01/2012  . Arthritis   . Heart disease   . Hemorrhoids   . Hypertension   . GERD (gastroesophageal reflux disease)   . Peptic ulcer disease     PAST SURGICAL HISTORY:  Past Surgical History  Procedure Laterality Date  . Esophagogastroduodenoscopy N/A 01/08/2015    Procedure: ESOPHAGOGASTRODUODENOSCOPY (EGD);  Surgeon: Scot Jun, MD;  Location: [facility] ENDOSCOPY;  Service: Endoscopy;  Laterality: N/A;  . Laparoscopy     Bariatric Sx 8/22  . Other surgical history      IVC Filter    ALLERGIES:  is allergic to lisinopril.  MEDICATIONS:  Current Facility-Administered Medications  Medication Dose Route Frequency Provider Last Rate Last Dose  . 0.9 %  sodium chloride infusion  10 mL/hr Intravenous Once Rebecka Apley, MD 10 mL/hr at 04/13/15 1515 10 mL/hr at 04/13/15 1515  . amiodarone (NEXTERONE PREMIX) 360 MG/200ML (1.8 mg/mL) IV infusion  30 mg/hr Intravenous Continuous Merwyn Katos, MD   Stopped at 04/13/15 1657  . antiseptic oral rinse solution (CORINZ)  7 mL Mouth Rinse QID Gale Journey, MD   7 mL at 04/14/15 1200  . chlorhexidine gluconate (PERIDEX) 0.12 % solution 15 mL  15 mL Mouth Rinse BID Gale Journey, MD   15 mL at 04/14/15 0752  . Chlorhexidine Gluconate Cloth 2 % PADS 6 each  6 each Topical Q0600 Gale Journey, MD      . dextrose 5 % solution   Intravenous Continuous Merwyn Katos, MD 50 mL/hr at 04/14/15 1511    . fentaNYL (SUBLIMAZE) injection 25-100 mcg  25-100 mcg Intravenous Q2H PRN Merwyn Katos, MD      . hydrocortisone sodium succinate (SOLU-CORTEF) 100 MG injection 50 mg  50 mg Intravenous Q6H Erin Fulling, MD   50 mg at 04/14/15 1259  . Influenza vac split quadrivalent PF (FLUARIX) injection 0.5 mL  0.5 mL Intramuscular Tomorrow-1000 Gale Journey, MD      .  insulin aspart (novoLOG) injection 0-20 Units  0-20 Units Subcutaneous 6 times per day Merwyn Katos, MD   3 Units at 04/14/15 [phone]  . lacosamide (VIMPAT) 50 mg in sodium chloride 0.9 % 25 mL IVPB  50 mg Intravenous Q12H Mellody Drown, MD      . LORazepam (ATIVAN) injection 0.5-1 mg  0.5-1 mg Intravenous Q2H PRN Merwyn Katos, MD      . mupirocin ointment (BACTROBAN) 2 % 1 application  1 application Nasal BID Gale Journey, MD   1 application at 04/14/15 1059  . norepinephrine (LEVOPHED) 16 mg in dextrose 5 % 250 mL (0.064 mg/mL) infusion  0-40 mcg/min Intravenous Continuous Merwyn Katos, MD 18.8  mL/hr at 04/14/15 1540 20 mcg/min at 04/14/15 1540  . pantoprazole (PROTONIX) 80 mg in sodium chloride 0.9 % 250 mL (0.32 mg/mL) infusion  8 mg/hr Intravenous Continuous Rebecka Apley, MD 25 mL/hr at 04/14/15 1540 8 mg/hr at 04/14/15 1540  . [START ON 04/16/2015] pantoprazole (PROTONIX) injection 40 mg  40 mg Intravenous Q12H Rebecka Apley, MD      . phytonadione (VITAMIN K) 10 mg in dextrose 5 % 50 mL IVPB  10 mg Intravenous Daily Merwyn Katos, MD   10 mg at 04/14/15 1511  . piperacillin-tazobactam (ZOSYN) IVPB 3.375 g  3.375 g Intravenous 3 times per day Gale Journey, MD   3.375 g at 04/14/15 1059  . pneumococcal 23 valent vaccine (PNU-IMMUNE) injection 0.5 mL  0.5 mL Intramuscular Tomorrow-1000 Gale Journey, MD      . sodium chloride 0.9 % bolus 500 mL  500 mL Intravenous PRN Merwyn Katos, MD      . sodium chloride 0.9 % injection 3 mL  3 mL Intravenous Q12H Gale Journey, MD   3 mL at 04/14/15 1059  . thiamine (B-1) injection 100 mg  100 mg Intravenous Daily Mellody Drown, MD   100 mg at 04/14/15 1430  . vasopressin (PITRESSIN) 40 Units in sodium chloride 0.9 % 250 mL (0.16 Units/mL) infusion  0.03 Units/min Intravenous Continuous Erin Fulling, MD 11.3 mL/hr at 04/14/15 1319 0.03 Units/min at 04/14/15 1319    Vital Signs: BP 110/81 mmHg  Pulse 74  Temp(Src) 99.3 F (37.4 C) (Rectal)  Resp 15  Ht 5\' 3"  (1.6 m)  Wt 173 kg (381 lb 6.3 oz)  BMI 67.58 kg/m2  SpO2 99% Filed Weights   04/13/15 1500  Weight: 173 kg (381 lb 6.3 oz)    Estimated body mass index is 67.58 kg/(m^2) as calculated from the following:   Height as of this encounter: 5\' 3"  (1.6 m).   Weight as of this encounter: 173 kg (381 lb 6.3 oz).  PERFORMANCE STATUS (ECOG) : 4 - Bedbound  PHYSICAL EXAM: Not responsive Intubated and not sedated Pupils on my exam are nonreactive and fixed at midpoint with disconjugate gaze No JVD or thyromegaly Heart rrr no mgr Abd surgical wounds clean --JP  draining.  Ext skin w/o mottling and no contractures or posturing is noted Neuro --not responsive to pain or other stimuli   LABS: CBC:    Component Value Date/Time   WBC 18.2* 04/14/2015 1513   WBC 5.8 08/02/2014 1312   HGB 10.3* 04/14/2015 1513   HGB 11.5* 08/02/2014 1312   HCT 31.5* 04/14/2015 1513   HCT 36.5 08/02/2014 1312   PLT 121* 04/14/2015 1513   PLT 216 08/30/2014 1025   MCV 79.5* 04/14/2015 1513   MCV 83  08/02/2014 1312   NEUTROABS 2.9 12/30/2014 1706   NEUTROABS 3.9 08/02/2014 1312   LYMPHSABS 1.4 12/30/2014 1706   LYMPHSABS 1.3 08/02/2014 1312   MONOABS 0.3 12/30/2014 1706   MONOABS 0.3 08/02/2014 1312   EOSABS 0.2 12/30/2014 1706   EOSABS 0.2 08/02/2014 1312   BASOSABS 0.0 12/30/2014 1706   BASOSABS 0.0 08/02/2014 1312   Comprehensive Metabolic Panel:    Component Value Date/Time   NA 142 04/14/2015 0504   NA 139 08/31/2011 0733   K 3.8 04/14/2015 0504   K 3.8 08/31/2011 0733   CL 110 04/14/2015 0504   CL 107 08/31/2011 0733   CO2 21* 04/14/2015 0504   CO2 25 08/31/2011 0733   BUN 78* 04/14/2015 0504   BUN 10 08/31/2011 0733   CREATININE 3.18* 04/14/2015 0504   CREATININE 0.74 08/02/2014 1312   GLUCOSE 138* 04/14/2015 0504   GLUCOSE 77 08/31/2011 0733   CALCIUM 7.7* 04/14/2015 0504   CALCIUM 9.0 08/31/2011 0733   AST 85* 04/14/2015 0504   ALT 62* 04/14/2015 0504   ALKPHOS 53 04/14/2015 0504   BILITOT 3.2* 04/14/2015 0504   PROT 5.9* 04/14/2015 0504   ALBUMIN 2.0* 04/14/2015 0504    IMPRESSION: 1.  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 ---An EEG is pending as per neurologist.  2. Post Gastric Bypass  procedure 04/07/15  ---with post-operative complication of purulent drainage from JP drain ---with subsequent development of sepsis which precipitated the cardiopulmonary arrest 3.  Shock ---due to sepsis and also cardiogenic ---bacteremia noted (GNR in bl cxs ) ---continues on pressors (levophed and vasopressin) 4.  S/P Cardiac arrest 5.  Acute respiratory failure --on full vent support 6.  Coagulopathy 7.  Acute systolic CHF  ---EF =35% with no known prior heart history 8.  Acute Renal Failure 9.  Elevated bilirubin 10.  Hypoalbuminemia 11.  Anemia of unclear etiology 12.  Leukocytosis 13.  Metabolic acidosis 14. Cardiomegaly and pulmonary edema   See top of note for plan  REFERRALS TO BE ORDERED:  None as yet   More than 50% of the visit was spent in counseling/coordination of care: Yes  Time Spent:  80 minutes

## 2015-04-14 NOTE — Consult Note (Signed)
PULMONARY / CRITICAL CARE MEDICINE   Name: Paige [name] MRN: [id number] DOB: 1950-10-14    ADMISSION DATE:  April 26, 2015  PT PROFILE:  10 F underwent bariatric surgery 8/22 at [facility]. Noted to have had normal renal function 8/24. Was discharged to home 8/26. EMS called to home 04-26-2023 for dyspnea. Coded in ambulance. Approx 10 mins ACLS. Admitted to ICU with profound coagulopathy (was discharged to home on enoxaparin 120 mg BID), shock, atrial fibrillation, VRDF, AKI, post anoxic encephalopathy. She has a PMH of DVT, s/p IVC filter, recent gastric ulcer  MAJOR EVENTS/TEST RESULTS: 04/26/2023 Echocardiogram: EF 35% 8/29 Ct head>>  INDWELLING DEVICES:: ETT April 26, 2023 >>  R IJ CVL April 26, 2023 >>  R femoral A-line Apr 26, 2023 >>   MICRO DATA: Blood Apr 26, 2023 >>    ANTIMICROBIALS:  Pip-tazo 2023/04/26 >>   EVENTS overnight: on vasopressors, on full vent support, increased WOB, GCs8T, unresponsive, daughter at bedside-updated Will obtain CT head, ECHO shows Ef 35%   PAST MEDICAL HISTORY :   has a past medical history of Blood clot in vein (01/2012); Arthritis; Heart disease; Hemorrhoids; Hypertension; GERD (gastroesophageal reflux disease); and Peptic ulcer disease.  has past surgical history that includes Esophagogastroduodenoscopy (N/A, 01/08/2015); laparoscopy; and Other surgical history. Prior to Admission medications   Medication Sig Start Date End Date Taking? Authorizing Provider  amLODipine (NORVASC) 5 MG tablet Take 5 mg by mouth daily.   Yes Historical Provider, MD  aspirin 81 MG tablet Take 81 mg by mouth 2 (two) times a week.    Yes Historical Provider, MD  carvedilol (COREG) 25 MG tablet Take 50 mg by mouth 2 (two) times daily with a meal.   Yes Historical Provider, MD  cholecalciferol (VITAMIN D) 400 UNITS TABS tablet Take 800 Units by mouth every morning.   Yes Historical Provider, MD  hydrALAZINE (APRESOLINE) 50 MG tablet Take 50 mg by mouth 2 (two) times daily.    Yes Historical Provider, MD   HYDROcodone-acetaminophen (HYCET) 7.5-325 mg/15 ml solution Take 15 mLs by mouth every 4 (four) hours as needed. For up to 7 days for pain. 04/07/15 04/14/15 Yes Historical Provider, MD  omeprazole (PRILOSEC) 40 MG capsule Take 40 mg by mouth every morning.  12/09/14  Yes Historical Provider, MD  ondansetron (ZOFRAN ODT) 4 MG disintegrating tablet Take 4 mg by mouth every 4 (four) hours as needed. For up to 7 days 04/07/15 04/14/15 Yes Historical Provider, MD  warfarin (COUMADIN) 3 MG tablet Take 9 mg by mouth every evening. Take with evening meal. 11/30/14  Yes Historical Provider, MD  enoxaparin (LOVENOX) 120 MG/0.8ML injection Inject 0.8 mLs (120 mg total) into the skin every 12 (twelve) hours. 01/05/15 01/12/15  Marin Roberts, MD   Allergies  Allergen Reactions  . Lisinopril Swelling    This medication makes patient lips swell.    FAMILY HISTORY:  indicated that her mother is deceased. She indicated that her father is deceased.  SOCIAL HISTORY:  reports that she has never smoked. She has never used smokeless tobacco. She reports that she does not drink alcohol or use illicit drugs.  REVIEW OF SYSTEMS:  Unable to obtain due to AMS    VITAL SIGNS: Temp:  [94.8 F (34.9 C)-99.1 F (37.3 C)] 99 F (37.2 C) (08/29 0830) Pulse Rate:  [24-117] 98 (08/29 0830) Resp:  [18-30] 21 (08/29 0830) BP: (82-124)/(42-94) 100/73 mmHg (08/29 0830) SpO2:  [81 %-100 %] 97 % (08/29 0830) Arterial Line BP: (69-106)/(38-63) 96/57 mmHg (08/29 0830) FiO2 (%):  [  50 %-100 %] 50 % (08/29 0757) Weight:  [381 lb 6.3 oz (173 kg)] 381 lb 6.3 oz (173 kg) (08/28 1500) HEMODYNAMICS: CVP:  [0 mmHg-15 mmHg] 13 mmHg VENTILATOR SETTINGS: Vent Mode:  [-] PRVC FiO2 (%):  [50 %-100 %] 50 % Set Rate:  [16 bmp] 16 bmp Vt Set:  [450 mL] 450 mL PEEP:  [5 cmH20] 5 cmH20 INTAKE / OUTPUT:  Intake/Output Summary (Last 24 hours) at 04/14/15 0920 Last data filed at 04/14/15 0800  Gross per 24 hour  Intake 2538.26 ml   Output   2235 ml  Net 303.26 ml    PHYSICAL EXAMINATION: General: Morbidly obese, comatose, intubated Neuro: PERRL, no spontaneous movement, no withdrawal from pain HEENT: NCAT Cardiovascular: IRIR, mildly tachy, no M noted Lungs: coarse BS, no wheezes Abdomen: Markedly obese, maroon aspirate from NGT (now removed), maroon aspirate in JP drain, sequelae of recent surgery noted, induration in SQ tissues on lower abd Ext: 1+ symmetric LE edema  LABS:  CBC  Recent Labs Lab 04/13/15 1718 04/13/15 2344 04/14/15 0504  WBC 11.2* 16.1* 17.5*  HGB 11.0* 11.1* 11.2*  HCT 33.6* 34.5* 34.7*  PLT 152 147* 146*   Coag's  Recent Labs Lab 04/13/15 1718 04/13/15 2344 04/14/15 0504  APTT 45* 42* 41*  INR 1.93 1.76 1.69   BMET  Recent Labs Lab 04/13/15 1130 04/13/15 2157 04/14/15 0504  NA 138 141 142  K 4.4 3.7 3.8  CL 108 110 110  CO2 20* 21* 21*  BUN 72* 75* 78*  CREATININE 3.68* 3.48* 3.18*  GLUCOSE 164* 141* 138*   Electrolytes  Recent Labs Lab 04/13/15 0625 04/13/15 1130 04/13/15 2157 04/14/15 0504  CALCIUM 7.9* 6.9* 7.5* 7.7*  MG 2.7*  --   --   --    Sepsis Markers  Recent Labs Lab 04/13/15 0642 04/13/15 1130 04/14/15 0504  LATICACIDVEN 6.3* 1.7 1.8  PROCALCITON  --   --  66.58   ABG  Recent Labs Lab 04/13/15 0639 04/13/15 1400 04/14/15 0517  PHART 7.29* 7.35 7.44  PCO2ART 37 34 30*  PO2ART 174* 209* 92   Liver Enzymes  Recent Labs Lab 04/13/15 1130 04/14/15 0504  AST 179* 85*  ALT 90* 62*  ALKPHOS 64 53  BILITOT 1.8* 3.2*  ALBUMIN 1.8* 2.0*   Cardiac Enzymes  Recent Labs Lab 04/13/15 1718 04/13/15 2157 04/14/15 0504  TROPONINI <0.03 <0.03 0.03   Glucose  Recent Labs Lab 04/13/15 1055 04/13/15 1636 04/13/15 1950 04/14/15 0027 04/14/15 0405 04/14/15 0724  GLUCAP 141* 115* 115* 101* 116* 131*    CXR: Low volumes, vasc congestion, no edema or infiltrates   ASSESSMENT / PLAN:   64 yo morbildy obese AAF  admitted to ICU for acute cardiac arrest and resp failure showing signs of anoxic brain injury Likely cause is acute CHF and acute cardiac arrest from MI from acute bleeding/anemia with shock   CARDIOVASCULAR A: Cardiac arrest - inciting event unclear Shock - suspect in part hemorrhagic. 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, s/p IVC filter P:   Vent settings established Vent bundle implemented Daily SBT as indicated   RENAL A:   AKI -  P:   Monitor BMET intermittently Monitor I/Os Correct electrolytes as  indicated Renal service following  GASTROINTESTINAL A:   Post bariatric surgery 8/22 Morbid obesity P:   - IV PPI (currently continuous infusion) Consider TFs when hemodynamically stable GI service following -follow up gen surgery recs and bariatric surgery recs  HEMATOLOGIC A:  Severe coagulopathy - suspect due to enoxaparin in setting of AKI Acute blood loss anemia P:  Transfuse PRBCs to maintain Hgb > 9.0 for now Transfuse FFP to maintain PT-INR < 1.5, PTT < 40 sec for now Monitor CBC, coags frequently over next couple of days' DIC panel ordered for 8/29  INFECTIOUS A:   Concern for intra-abdominal infection P:   Monitor temp, WBC count Micro and abx as above  ENDOCRINE A:   Hypeglycemia P:   SSI, resistant scale  NEUROLOGIC-obtain CT head to assess for bleed and anoxia A:   Post anoxic encephalopathy Not a candidate for hypothermia due to coagulopathy P:   RASS goal: 0, -1 Minimize sedatives   FAMILY  8/29  I spoke with pt's daughter and provided a complete update including a summary of the multiple derangements identified above and the guarded prognosis   I have personally obtained a  history, examined the patient, evaluated Pertinent laboratory and RadioGraphic/imaging results, and  formulated the assessment and plan   The Patient requires high complexity decision making for assessment and support, frequent evaluation and titration of therapies, application of advanced monitoring technologies and extensive interpretation of multiple databases. Critical Care Time devoted to patient care services described in this note is 45 minutes.   Overall, patient is critically ill, prognosis is guarded.  Patient with Multiorgan failure and at high risk for cardiac arrest and death.    Lucie Leather, M.D.  [facility]  Medical Director [facility] Medical Director [facility] Cardio-Pulmonary Department

## 2015-04-15 ENCOUNTER — Inpatient Hospital Stay: Payer: BLUE CROSS/BLUE SHIELD

## 2015-04-15 ENCOUNTER — Encounter
Admission: EM | Disposition: A | Discharge status: Other Acute Inpatient Hospital | Payer: Self-pay | Source: Home / Self Care | Attending: Internal Medicine

## 2015-04-15 ENCOUNTER — Inpatient Hospital Stay: Payer: BLUE CROSS/BLUE SHIELD | Admitting: Anesthesiology

## 2015-04-15 DIAGNOSIS — K625 Hemorrhage of anus and rectum: Secondary | ICD-10-CM

## 2015-04-15 DIAGNOSIS — R6520 Severe sepsis without septic shock: Secondary | ICD-10-CM

## 2015-04-15 DIAGNOSIS — J9601 Acute respiratory failure with hypoxia: Secondary | ICD-10-CM

## 2015-04-15 DIAGNOSIS — A419 Sepsis, unspecified organism: Secondary | ICD-10-CM | POA: Insufficient documentation

## 2015-04-15 HISTORY — PX: CARDIAC CATHETERIZATION: SHX172

## 2015-04-15 HISTORY — PX: LAPAROTOMY: SHX154

## 2015-04-15 HISTORY — PX: LAPAROSCOPY: SHX197

## 2015-04-15 LAB — COMPREHENSIVE METABOLIC PANEL
ALBUMIN: 1.8 g/dL — AB (ref 3.5–5.0)
ALK PHOS: 62 U/L (ref 38–126)
ALT: 49 U/L (ref 14–54)
AST: 48 U/L — AB (ref 15–41)
Anion gap: 7 (ref 5–15)
BUN: 65 mg/dL — ABNORMAL HIGH (ref 6–20)
CALCIUM: 7.8 mg/dL — AB (ref 8.9–10.3)
CO2: 23 mmol/L (ref 22–32)
CREATININE: 1.94 mg/dL — AB (ref 0.44–1.00)
Chloride: 113 mmol/L — ABNORMAL HIGH (ref 101–111)
GFR calc Af Amer: 30 mL/min — ABNORMAL LOW (ref >60)
GFR calc non Af Amer: 26 mL/min — ABNORMAL LOW (ref >60)
GLUCOSE: 146 mg/dL — AB (ref 65–99)
Potassium: 3.1 mmol/L — ABNORMAL LOW (ref 3.5–5.1)
SODIUM: 143 mmol/L (ref 135–145)
Total Bilirubin: 2 mg/dL — ABNORMAL HIGH (ref 0.3–1.2)
Total Protein: 5.6 g/dL — ABNORMAL LOW (ref 6.5–8.1)

## 2015-04-15 LAB — GLUCOSE, CAPILLARY
GLUCOSE-CAPILLARY: 125 mg/dL — AB (ref 65–99)
GLUCOSE-CAPILLARY: 99 mg/dL (ref 65–99)
Glucose-Capillary: 114 mg/dL — ABNORMAL HIGH (ref 65–99)
Glucose-Capillary: 119 mg/dL — ABNORMAL HIGH (ref 65–99)
Glucose-Capillary: 144 mg/dL — ABNORMAL HIGH (ref 65–99)
Glucose-Capillary: 97 mg/dL (ref 65–99)

## 2015-04-15 LAB — CBC
HCT: 28.6 % — ABNORMAL LOW (ref 35.0–47.0)
HEMOGLOBIN: 9.4 g/dL — AB (ref 12.0–16.0)
MCH: 26.2 pg (ref 26.0–34.0)
MCHC: 32.8 g/dL (ref 32.0–36.0)
MCV: 80 fL (ref 80.0–100.0)
Platelets: 95 10*3/uL — ABNORMAL LOW (ref 150–440)
RBC: 3.58 MIL/uL — AB (ref 3.80–5.20)
RDW: 18.8 % — ABNORMAL HIGH (ref 11.5–14.5)
WBC: 18.6 10*3/uL — AB (ref 3.6–11.0)

## 2015-04-15 LAB — PROTIME-INR
INR: 1.43
Prothrombin Time: 17.6 seconds — ABNORMAL HIGH (ref 11.4–15.0)

## 2015-04-15 LAB — OCCULT BLOOD X 1 CARD TO LAB, STOOL: FECAL OCCULT BLD: POSITIVE — AB

## 2015-04-15 SURGERY — LAPAROSCOPY, DIAGNOSTIC
Anesthesia: General | Site: Neck | Laterality: Right

## 2015-04-15 MED ORDER — PHENYLEPHRINE HCL 10 MG/ML IJ SOLN
10.0000 mg | INTRAVENOUS | Status: DC | PRN
  Administered 2015-04-15: 40 ug/min via INTRAVENOUS

## 2015-04-15 MED ORDER — LIDOCAINE-EPINEPHRINE (PF) 1 %-1:200000 IJ SOLN
INTRAMUSCULAR | Status: DC | PRN
  Administered 2015-04-15: 2 mL

## 2015-04-15 MED ORDER — LIDOCAINE-EPINEPHRINE (PF) 1 %-1:200000 IJ SOLN
INTRAMUSCULAR | Status: AC
  Filled 2015-04-15: qty 30

## 2015-04-15 MED ORDER — POTASSIUM CHLORIDE 10 MEQ/100ML IV SOLN
10.0000 meq | INTRAVENOUS | Status: AC
  Administered 2015-04-15 (×3): 10 meq via INTRAVENOUS
  Filled 2015-04-15 (×4): qty 100

## 2015-04-15 MED ORDER — BUPIVACAINE HCL (PF) 0.5 % IJ SOLN
INTRAMUSCULAR | Status: AC
  Filled 2015-04-15: qty 30

## 2015-04-15 MED ORDER — SODIUM CHLORIDE 0.9 % IV SOLN
INTRAVENOUS | Status: DC
  Administered 2015-04-15 – 2015-04-16 (×2): via INTRAVENOUS

## 2015-04-15 MED ORDER — PHENYLEPHRINE HCL 10 MG/ML IJ SOLN
INTRAMUSCULAR | Status: DC | PRN
  Administered 2015-04-15: 300 ug via INTRAVENOUS
  Administered 2015-04-15 (×2): 200 ug via INTRAVENOUS
  Administered 2015-04-15: 100 ug via INTRAVENOUS
  Administered 2015-04-15: 200 ug via INTRAVENOUS

## 2015-04-15 MED ORDER — SODIUM CHLORIDE 0.9 % IV SOLN
500.0000 mg | Freq: Two times a day (BID) | INTRAVENOUS | Status: DC
  Administered 2015-04-15 – 2015-04-16 (×3): 500 mg via INTRAVENOUS
  Filled 2015-04-15 (×6): qty 0.5

## 2015-04-15 MED ORDER — ROCURONIUM BROMIDE 100 MG/10ML IV SOLN
INTRAVENOUS | Status: DC | PRN
  Administered 2015-04-15 (×2): 50 mg via INTRAVENOUS

## 2015-04-15 MED ORDER — MICAFUNGIN SODIUM 50 MG IV SOLR
50.0000 mg | INTRAVENOUS | Status: DC
  Administered 2015-04-15: 50 mg via INTRAVENOUS
  Filled 2015-04-15 (×2): qty 50

## 2015-04-15 MED ORDER — PANTOPRAZOLE SODIUM 40 MG IV SOLR
40.0000 mg | Freq: Two times a day (BID) | INTRAVENOUS | Status: DC
  Administered 2015-04-15 – 2015-04-18 (×7): 40 mg via INTRAVENOUS
  Filled 2015-04-15 (×7): qty 40

## 2015-04-15 MED ORDER — SODIUM CHLORIDE 0.9 % IV SOLN
INTRAVENOUS | Status: DC | PRN
Start: 2015-04-15 — End: 2015-04-15
  Administered 2015-04-15 (×2): via INTRAVENOUS

## 2015-04-15 MED ORDER — HYDROCORTISONE NA SUCCINATE PF 100 MG IJ SOLR: 50.0000 mg | Freq: Two times a day (BID) | INTRAMUSCULAR | Status: DC

## 2015-04-15 MED ORDER — PROPOFOL 10 MG/ML IV BOLUS
INTRAVENOUS | Status: DC | PRN
  Administered 2015-04-15: 100 mg via INTRAVENOUS

## 2015-04-15 MED ORDER — FENTANYL CITRATE (PF) 100 MCG/2ML IJ SOLN
INTRAMUSCULAR | Status: DC | PRN
  Administered 2015-04-15 (×2): 50 ug via INTRAVENOUS

## 2015-04-15 MED ORDER — POTASSIUM CHLORIDE 10 MEQ/100ML IV SOLN
10.0000 meq | INTRAVENOUS | Status: DC
  Administered 2015-04-15: 10 meq via INTRAVENOUS

## 2015-04-15 MED ORDER — 0.9 % SODIUM CHLORIDE (POUR BTL) OPTIME
TOPICAL | Status: DC | PRN
  Administered 2015-04-15: 6000 mL

## 2015-04-15 MED ORDER — INSULIN ASPART 100 UNIT/ML ~~LOC~~ SOLN: 0.0000 [IU] | SUBCUTANEOUS | Status: DC

## 2015-04-15 SURGICAL SUPPLY — 96 items
669  ETHILON IMPLANT
APPLIER CLIP ROT 13.4 12 LRG (CLIP)
BAG URINE LEG 25OZ (MISCELLANEOUS) ×4 IMPLANT
BANDAGE ELASTIC 6 CLIP NS LF (GAUZE/BANDAGES/DRESSINGS) ×4 IMPLANT
BLADE SURG SZ11 CARB STEEL (BLADE) ×8 IMPLANT
BULB RESERV EVAC DRAIN JP 100C (MISCELLANEOUS) ×4 IMPLANT
BULB SYRINGE ×4 IMPLANT
CANISTER SUCT 1200ML W/VALVE (MISCELLANEOUS) ×8 IMPLANT
CANISTER SUCT 3000ML PPV (MISCELLANEOUS) ×12 IMPLANT
CATH ROBINSON RED 14FR (CATHETERS) ×2 IMPLANT
CATH TRAY 16F METER LATEX (MISCELLANEOUS) IMPLANT
CATH URET ROBINSON RED 14FR (CATHETERS) ×2
CAUTERY IMPLANT
CAUTERY TIP IMPLANT
CAUTERY TIP POLISHER IMPLANT
CHLORAPREP W/TINT 26ML (MISCELLANEOUS) ×8 IMPLANT
CLIP APPLIE ROT 13.4 12 LRG (CLIP) IMPLANT
DECANTER SPIKE VIAL GLASS SM (MISCELLANEOUS) ×4 IMPLANT
DEFOGGER SCOPE WARMER CLEARIFY (MISCELLANEOUS) ×4 IMPLANT
DRAIN CHANNEL JP 19F (MISCELLANEOUS) ×12 IMPLANT
DRAPE LAPAROTOMY 100X77 ABD (DRAPES) IMPLANT
DRAPE UTILITY 15X26 TOWEL STRL (DRAPES) ×8 IMPLANT
ELECT BLADE 6.5 EXT (BLADE) ×4 IMPLANT
ELECT CAUTERY BLADE 6.4 (BLADE) IMPLANT
FILTER LAP SMOKE EVAC STRL (MISCELLANEOUS) ×4 IMPLANT
GAUZE PACKING IODOFORM 1X5 (MISCELLANEOUS) ×4 IMPLANT
GAUZE SPONGE 4X4 12PLY STRL (GAUZE/BANDAGES/DRESSINGS) ×8 IMPLANT
GLOVE BIO SURGEON STRL SZ 6.5 (GLOVE) ×6 IMPLANT
GLOVE BIO SURGEON STRL SZ7.5 (GLOVE) ×12 IMPLANT
GLOVE BIO SURGEON STRL SZ8 (GLOVE) ×16 IMPLANT
GLOVE BIO SURGEONS STRL SZ 6.5 (GLOVE) ×2
GLOVE INDICATOR 8.0 STRL GRN (GLOVE) ×4 IMPLANT
GOWN STRL REUS W/ TWL LRG LVL3 (GOWN DISPOSABLE) ×4 IMPLANT
GOWN STRL REUS W/ TWL XL LVL3 (GOWN DISPOSABLE) ×2 IMPLANT
GOWN STRL REUS W/TWL LRG LVL3 (GOWN DISPOSABLE) ×4
GOWN STRL REUS W/TWL XL LVL3 (GOWN DISPOSABLE) ×2
HANDLE YANKAUER SUCT BULB TIP (MISCELLANEOUS) ×4 IMPLANT
IRRIGATION STRYKERFLOW (MISCELLANEOUS) IMPLANT
IRRIGATOR STRYKERFLOW (MISCELLANEOUS)
IV NS 1000ML (IV SOLUTION)
IV NS 1000ML BAXH (IV SOLUTION) IMPLANT
JACKSON PRATT RESIVOIR IMPLANT
JP DRAIN 19 FR IMPLANT
KIT RM TURNOVER STRD PROC AR (KITS) ×4 IMPLANT
LABEL OR SOLS (LABEL) IMPLANT
LEG BAG IMPLANT
LIQUID BAND (GAUZE/BANDAGES/DRESSINGS) ×4 IMPLANT
NDL INSUFF 14G 150MM VS150000 (NEEDLE) ×4 IMPLANT
NDL SAFETY 22GX1.5 (NEEDLE) ×4 IMPLANT
NEEDLE VERESS 14GA 120MM (NEEDLE) ×4 IMPLANT
NS IRRIG 1000ML POUR BTL (IV SOLUTION) ×32 IMPLANT
NS IRRIG 500ML POUR BTL (IV SOLUTION) IMPLANT
PACK BASIN MAJOR ARMC (MISCELLANEOUS) IMPLANT
PACK LAP CHOLECYSTECTOMY (MISCELLANEOUS) ×4 IMPLANT
PAD GROUND ADULT SPLIT (MISCELLANEOUS) ×4 IMPLANT
PENCIL ELECTRO HAND CTR (MISCELLANEOUS) ×4 IMPLANT
PLUME-PORT ×4 IMPLANT
RELOAD BLUE (STAPLE) IMPLANT
RELOAD STAPLER GOLD 60MM (STAPLE) IMPLANT
RELOAD STAPLER WHITE 60MM (STAPLE) IMPLANT
SHEARS HARMONIC ACE PLUS 45CM (MISCELLANEOUS) IMPLANT
SHEARS HARMONIC STRL 23CM (MISCELLANEOUS) IMPLANT
SLEEVE ENDOPATH XCEL 5M (ENDOMECHANICALS) ×8 IMPLANT
SLEEVE GASTRECTOMY 36FR VISIGI (MISCELLANEOUS) ×4 IMPLANT
SPONGE LAP 18X18 5 PK (GAUZE/BANDAGES/DRESSINGS) ×12 IMPLANT
STAPLER ECHELON LONG 60 440 (INSTRUMENTS) ×4 IMPLANT
STAPLER RELOAD GOLD 60MM (STAPLE)
STAPLER RELOAD WHITE 60MM (STAPLE)
STAPLER SKIN PROX 35W (STAPLE) ×8 IMPLANT
SUT DEVICE BRAIDED 0X39 (SUTURE) ×8 IMPLANT
SUT DEVICE BRAIDED 2.0X39 (SUTURE) IMPLANT
SUT ETHILON 3-0 FS-10 30 BLK (SUTURE) ×8
SUT PDS AB 0 CT1 27 (SUTURE) ×8 IMPLANT
SUT PDS AB 1 TP1 54 (SUTURE) ×8 IMPLANT
SUT PDS AB 1 TP1 96 (SUTURE) IMPLANT
SUT SILK 2 0 (SUTURE) ×2
SUT SILK 2-0 30XBRD TIE 12 (SUTURE) ×2 IMPLANT
SUT SILK 3 0 (SUTURE)
SUT SILK 3-0 (SUTURE) ×2
SUT SILK 3-0 18XBRD TIE 12 (SUTURE) IMPLANT
SUT SILK 3-0 SH-1 18XCR BRD (SUTURE) ×2
SUT VIC AB 2-0 SH 27 (SUTURE) ×8
SUT VIC AB 2-0 SH 27XBRD (SUTURE) ×8 IMPLANT
SUT VIC AB 3-0 SH 27 (SUTURE) ×2
SUT VIC AB 3-0 SH 27X BRD (SUTURE) ×2 IMPLANT
SUT VIC AB 4-0 PS2 18 (SUTURE) ×4 IMPLANT
SUTURE EHLN 3-0 FS-10 30 BLK (SUTURE) ×4 IMPLANT
SUTURE SILK 3-0 SH-1 18XCR BRD (SUTURE) ×2 IMPLANT
SYR 20CC LL (SYRINGE) IMPLANT
TROCAR SL VERSASTEP 5M LG  B (MISCELLANEOUS) ×2
TROCAR SL VERSASTEP 5M LG B (MISCELLANEOUS) ×2 IMPLANT
TROCAR XCEL 12X100 BLDLESS (ENDOMECHANICALS) ×4 IMPLANT
TROCAR XCEL NON-BLD 5MMX100MML (ENDOMECHANICALS) ×8 IMPLANT
TUBING INSUFFLATOR HEATED (MISCELLANEOUS) ×4 IMPLANT
VICRYL 2-0 SH IMPLANT
WATER STERILE IRR 1000ML POUR (IV SOLUTION) IMPLANT

## 2015-04-15 NOTE — Transfer of Care (Signed)
Immediate Anesthesia Transfer of Care Note  Patient: Paige [name]  Procedure(s) Performed: Procedure(s) with comments: LAPAROSCOPY DIAGNOSTIC (N/A) EXPLORATORY LAPAROTOMY (N/A) CENTRAL LINE INSERTION (Right) - internakl jugular  Patient Location: PACU and ICU  Anesthesia Type:General  Level of Consciousness: unresponsive  Airway & Oxygen Therapy: Patient remains intubated per anesthesia plan  Post-op Assessment: Report given to RN and Post -op Vital signs reviewed and stable  Post vital signs: stable  Last Vitals:  Filed Vitals:   04/15/15 1700  BP: 138/107  Pulse: 91  Temp:   Resp: 16    Complications: No apparent anesthesia complications

## 2015-04-15 NOTE — Progress Notes (Signed)
Paged Dr Smitty Cords and Clent Ridges regarding stool positive- Dr Smitty Cords aware of CT results. Spoke with Family. Neuro consulted and spoke with family as well. Dr Sung Amabile aware that Central dislodged during EEG this am. Patients pressors turned off this am. Blood pressure WNL.

## 2015-04-15 NOTE — Progress Notes (Signed)
Call [phone] to reach me 24/7 on this pt.

## 2015-04-15 NOTE — Progress Notes (Signed)
   SUBJECTIVE: Pt currently having EEG performed.    Filed Vitals:   04/15/15 0400 04/15/15 0500 04/15/15 0600 04/15/15 0700  BP: 123/80 125/75 120/67 120/70  Pulse: 105 100 89 85  Temp: 99.7 F (37.6 C) 99.3 F (37.4 C) 99.5 F (37.5 C) 99.7 F (37.6 C)  TempSrc:      Resp: Height:      Weight:  178.3 kg (393 lb 1.3 oz)    SpO2: 98% 98% 98% 98%    Intake/Output Summary (Last 24 hours) at 04/15/15 0842 Last data filed at 04/15/15 0517  Gross per 24 hour  Intake    450 ml  Output   3200 ml  Net  -2750 ml    LABS: Basic Metabolic Panel:  Recent Labs  02/06/75 0625  04/14/15 0504 04/15/15 0446  NA 145  < > 142 143  K 4.7  < > 3.8 3.1*  CL 108  < > 110 113*  CO2 21*  < > 21* 23  GLUCOSE 189*  < > 138* 146*  BUN 78*  < > 78* 65*  CREATININE 4.62*  < > 3.18* 1.94*  CALCIUM 7.9*  < > 7.7* 7.8*  MG 2.7*  --   --   --   < > = values in this interval not displayed. Liver Function Tests:  Recent Labs  04/14/15 0504 04/15/15 0446  AST 85* 48*  ALT 62* 49  ALKPHOS 53 62  BILITOT 3.2* 2.0*  PROT 5.9* 5.6*  ALBUMIN 2.0* 1.8*   No results for input(s): LIPASE, AMYLASE in the last 72 hours. CBC:  Recent Labs  04/14/15 2321 04/15/15 0446  WBC 17.5* 18.6*  HGB 9.5* 9.4*  HCT 29.1* 28.6*  MCV 79.4* 80.0  PLT 95* 95*   Cardiac Enzymes:  Recent Labs  04/13/15 1718 04/13/15 2157 04/14/15 0504  TROPONINI <0.03 <0.03 0.03   BNP: Invalid input(s): POCBNP D-Dimer: No results for input(s): DDIMER in the last 72 hours. Hemoglobin A1C: No results for input(s): HGBA1C in the last 72 hours. Fasting Lipid Panel: No results for input(s): CHOL, HDL, LDLCALC, TRIG, CHOLHDL, LDLDIRECT in the last 72 hours. Thyroid Function Tests:  Recent Labs  04/14/15 1513  TSH 5.380*   Anemia Panel:  Recent Labs  04/14/15 1513  VITAMINB12 3600*  FOLATE 5.3*     PHYSICAL EXAM General: critically ill appearing HEENT:  Normocephalic and  atramatic Neck:  No JVD.  Lungs: rhonchi b/l Heart: irregularly irregular  TELEMETRY: Reviewed telemetry pt in atrial fibrillation VR in 80s  ASSESSMENT AND PLAN:  pt s/p cardiac arrest, troponin negative x3. Likely 2/2 septic shock.  CHF: Echo shows decreased EF (35%). Consider Bb if prognosis improves.   Atrial fibrillation: Amiodarone gtt never started 2/2 hypotension, rate controlled, will d/c order.     Patient and plan discussed with supervising provider, Dr. Adrian Blackwater, who agrees with above findings.   Paige [name] Margarito Courser [facility]  04/15/2015 8:42 AM

## 2015-04-15 NOTE — Anesthesia Preprocedure Evaluation (Signed)
Anesthesia Evaluation  Patient identified by MRN, date of birth, ID band Patient awake    Reviewed: Allergy & Precautions, H&P , NPO status , Patient's Chart, lab work & pertinent test results, reviewed documented beta blocker date and time   Airway Mallampati: III  TM Distance: >3 FB Neck ROM: full    Dental  (+) Teeth Intact   Pulmonary Current Smoker,  breath sounds clear to auscultation        Cardiovascular hypertension, + Peripheral Vascular Disease Rhythm:irregular Rate:Normal     Neuro/Psych Obtunded, unresponsive on vent    GI/Hepatic PUD, GERD-  ,  Endo/Other    Renal/GU Renal disease     Musculoskeletal   Abdominal   Peds  Hematology   Anesthesia Other Findings   Reproductive/Obstetrics                             Anesthesia Physical Anesthesia Plan  ASA: IV and emergent  Anesthesia Plan: General ETT   Post-op Pain Management:    Induction:   Airway Management Planned:   Additional Equipment:   Intra-op Plan:   Post-operative Plan:   Informed Consent: I have reviewed the patients History and Physical, chart, labs and discussed the procedure including the risks, benefits and alternatives for the proposed anesthesia with the patient or authorized representative who has indicated his/her understanding and acceptance.     Plan Discussed with: CRNA  Anesthesia Plan Comments:         Anesthesia Quick Evaluation

## 2015-04-15 NOTE — Progress Notes (Signed)
PULMONARY / CRITICAL CARE MEDICINE   Name: Paige [name] MRN: [id number] DOB: July 15, 1951    ADMISSION DATE:  May 10, 2015  PT PROFILE:  24 F underwent bariatric surgery 8/22 at [facility]. Noted to have had normal renal function 8/24. Was discharged to home 8/26. EMS called to home 05-10-23 for dyspnea. Coded in ambulance. Approx 10 mins ACLS. Admitted to ICU with profound coagulopathy (was discharged to home on enoxaparin 120 mg BID), shock, atrial fibrillation, VRDF, AKI, post anoxic encephalopathy. She has a PMH of DVT, s/p IVC filter, recent gastric ulcer  MAJOR EVENTS/TEST RESULTS: 05/10/23 Echocardiogram: EF 35% 8/29 CT head: Limited motion degraded study. Poor gray-white differentiation, possible cytotoxic edema from hypoxic ischemic injury. No intracranial hemorrhage or herniation 8/29 Echocardiogram: The LV cavity size was severely dilated. Systolic function was moderately reduced. The estimated ejection fraction was 35%. Akinesis of the anterior myocardium. Hypokinesis of the inferolateral myocardium. Akinesis of the apical myocardium  8/30 EEG: EEG with moderate slowing 8/30 repeat CT head: Motion degraded study. Note definite infarct, although the gray-white differentiation is difficult to evaluate in the cerebral hemispheres due to motion degradation 8/30 remains comatose. Off vasopressors. Renal function improving 8/30 Planned exploratory laparoscopy (Bruce):   INDWELLING DEVICES:: ETT May 10, 2023 >>  R IJ CVL 2023-05-10 >> 8/30 R femoral A-line 05/10/23 >>   MICRO DATA: Blood 05/10/23 >> GNRs >>    ANTIMICROBIALS:  Pip-tazo 05/10/2023 >>   SUBJ/INTERVAL: Remains comatose off all sedation   VITAL SIGNS: Temp:  [99 F (37.2 C)-99.7 F (37.6 C)] 99 F (37.2 C) (08/30 1230) Pulse Rate:  [72-205] 94 (08/30 1710) Resp:  [14-20] 14 (08/30 1630) BP: (100-131)/(64-95) 125/95 mmHg (08/30 1630) SpO2:  [96 %-100 %] 100 % (08/30 1630) Arterial Line BP: (92-132)/(53-75) 117/65 mmHg (08/30 1300) FiO2  (%):  [45 %] 45 % (08/30 1646) Weight:  [178.3 kg (393 lb 1.3 oz)] 178.3 kg (393 lb 1.3 oz) (08/30 0500) HEMODYNAMICS: CVP:  [8 mmHg-11 mmHg] 10 mmHg VENTILATOR SETTINGS: Vent Mode:  [-] PRVC FiO2 (%):  [45 %] 45 % Set Rate:  [16 bmp] 16 bmp Vt Set:  [450 mL] 450 mL PEEP:  [5 cmH20] 5 cmH20 INTAKE / OUTPUT:  Intake/Output Summary (Last 24 hours) at 04/15/15 1711 Last data filed at 04/15/15 0517  Gross per 24 hour  Intake      0 ml  Output   2550 ml  Net  -2550 ml    PHYSICAL EXAMINATION: General: Morbidly obese, comatose, intubated Neuro: PERRL, no spontaneous movement, no withdrawal from pain HEENT: NCAT Cardiovascular: IRIR, mildly tachy, no M noted Lungs: coarse BS, no wheezes Abdomen: Markedly obese, maroon aspirate from NGT (now removed), maroon aspirate in JP drain, sequelae of recent surgery noted, induration in SQ tissues on lower abd Ext: 1+ symmetric LE edema  LABS:  CBC  Recent Labs Lab 04/14/15 1513 04/14/15 2321 04/15/15 0446  WBC 18.2* 17.5* 18.6*  HGB 10.3* 9.5* 9.4*  HCT 31.5* 29.1* 28.6*  PLT 121* 95* 95*   Coag's  Recent Labs Lab 2015-05-10 1718 05/10/15 2344 04/14/15 0504 04/15/15 0446  APTT 45* 42* 41*  --   INR 1.93 1.76 1.69 1.43   BMET  Recent Labs Lab May 10, 2015 2157 04/14/15 0504 04/15/15 0446  NA 141 142 143  K 3.7 3.8 3.1*  CL 110 110 113*  CO2 21* 21* 23  BUN 75* 78* 65*  CREATININE 3.48* 3.18* 1.94*  GLUCOSE 141* 138* 146*   Electrolytes  Recent Labs Lab  04/13/15 1610  04/13/15 2157 04/14/15 0504 04/15/15 0446  CALCIUM 7.9*  < > 7.5* 7.7* 7.8*  MG 2.7*  --   --   --   --   < > = values in this interval not displayed. Sepsis Markers  Recent Labs Lab 04/13/15 0642 04/13/15 1130 04/14/15 0504  LATICACIDVEN 6.3* 1.7 1.8  PROCALCITON  --   --  66.58   ABG  Recent Labs Lab 04/13/15 0639 04/13/15 1400 04/14/15 0517  PHART 7.29* 7.35 7.44  PCO2ART 37 34 30*  PO2ART 174* 209* 92   Liver  Enzymes  Recent Labs Lab 04/13/15 1130 04/14/15 0504 04/15/15 0446  AST 179* 85* 48*  ALT 90* 62* 49  ALKPHOS 64 53 62  BILITOT 1.8* 3.2* 2.0*  ALBUMIN 1.8* 2.0* 1.8*   Cardiac Enzymes  Recent Labs Lab 04/13/15 1718 04/13/15 2157 04/14/15 0504  TROPONINI <0.03 <0.03 0.03   Glucose  Recent Labs Lab 04/14/15 1955 04/15/15 0022 04/15/15 0408 04/15/15 0742 04/15/15 1138 04/15/15 1614  GLUCAP 133* 119* 144* 125* 99 114*    CXR: NNF   ASSESSMENT / PLAN:  CARDIOVASCULAR A: Cardiac arrest Septic and hemorrhagic shock, resolved New onset AF P:  CVP goal 12-15 mmHg -MAP goal > 65 mmHg  PULMONARY A: VDRF post arrest 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 Discussed with Dr Smitty Cords  HEMATOLOGIC A:  Severe coagulopathy, resolved Acute blood loss anemia. No further bleeding evident P:  DVT px: SCDs Monitor CBC intermittently Transfuse per usual guidelines  INFECTIOUS A:   GNR bacteremia - likely abdominal source P:   Monitor temp, WBC count Micro and abx as above  ENDOCRINE A:   Hyperglycemia P:   Cont SSI, change to moderate scale  NEUROLOGIC A:   Post anoxic encephalopathy - appears severe P:   RASS goal: 0, -1 Minimize sedatives Neurology following   FAMILY  Daughter and son in law updated in detail. Discussed with Dr Clent Ridges and Dr Smitty Cords   CCM time 45 mins   Billy Fischer, MD PCCM service Mobile [phone] Pager [phone]

## 2015-04-15 NOTE — Progress Notes (Signed)
[facility] - [place] at [facility]   PATIENT NAME: Paige [name]    MR#:  [id number]  DATE OF BIRTH:  07-12-1951  SUBJECTIVE:  CHIEF COMPLAINT:  No chief complaint on file.  Continues on full ventilator support, pressors, non responsive without sedation. Now with dark red blood per rectum as well as per JP drain  REVIEW OF SYSTEMS:   ROS unable to obtain due to AMS  DRUG ALLERGIES:   Allergies  Allergen Reactions  . Lisinopril Swelling    This medication makes patient lips swell.    VITALS:  Blood pressure 120/70, pulse 85, temperature 99.7 F (37.6 C), temperature source Rectal, resp. rate 15, height  (1.6 m), weight 178.3 kg (393 lb 1.3 oz), SpO2 98 %.  PHYSICAL EXAMINATION:  GENERAL:  64 y.o.-year-old patient lying in the bed critically ill, on ventilator, nonresponsive EYES: Pupils equal, round, sluggish reaction. No scleral icterus.  LUNGS: mechanical ventilation, fair air movement, no wheezes or rhonchi.  CARDIOVASCULAR: S1, S2 normal. No murmurs, rubs, or gallops.  ABDOMEN: Soft, nondistended. Bowel sounds decrease, laparoscopic surgery wounds are clean and dry, dark thin material in JP drain. Dark bloody material per rectum EXTREMITIES: No pedal edema, cyanosis, or clubbing. Pulses +1 NEUROLOGIC: non responsive to physical or verbal stimuli PSYCHIATRIC: non responsive SKIN: surgical wounds are clean and dry, thick dark material via JP drain  LABORATORY PANEL:   CBC  Recent Labs Lab 04/15/15 0446  WBC 18.6*  HGB 9.4*  HCT 28.6*  PLT 95*   ------------------------------------------------------------------------------------------------------------------  Chemistries   Recent Labs Lab 04/13/15 0625  04/15/15 0446  NA 145  < > 143  K 4.7  < > 3.1*  CL 108  < > 113*  CO2 21*  < > 23  GLUCOSE 189*  < > 146*  BUN 78*  < > 65*  CREATININE 4.62*  < > 1.94*  CALCIUM 7.9*  < > 7.8*  MG 2.7*  --   --   AST  --   < > 48*   ALT  --   < > 49  ALKPHOS  --   < > 62  BILITOT  --   < > 2.0*  < > = values in this interval not displayed. ------------------------------------------------------------------------------------------------------------------  Cardiac Enzymes  Recent Labs Lab 04/14/15 0504  TROPONINI 0.03   ------------------------------------------------------------------------------------------------------------------  RADIOLOGY:  Ct Head Wo Contrast  04/15/2015   CLINICAL DATA:  Cardiac arrest.  Altered mental status.  EXAM: CT HEAD WITHOUT CONTRAST  TECHNIQUE: Contiguous axial images were obtained from the base of the skull through the vertex without intravenous contrast.  COMPARISON:  04/14/2015  FINDINGS: As prior study, the study quality is is limited and degraded by motion. It is difficult to completely exclude some edema within the cerebral hemispheres bilaterally due to motion degradation. Note definite convincing changes of infarct. No hemorrhage or hydrocephalus.  IMPRESSION: Motion degraded study. Note definite infarct, although the gray-white differentiation is difficult to evaluate in the cerebral hemispheres due to motion degradation.   Electronically Signed   By: Charlett Nose M.D.   On: 04/15/2015 14:44   Ct Head Wo Contrast  04/14/2015   CLINICAL DATA:  Cardiac arrest yesterday with encephalopathy.  EXAM: CT HEAD WITHOUT CONTRAST  TECHNIQUE: Contiguous axial images were obtained from the base of the skull through the vertex without intravenous contrast.  COMPARISON:  None.  FINDINGS: Skull and Sinuses:Negative for fracture or destructive process.  Fluid levels in the sphenoid  sinuses, likely from intubation status.  Orbits: No acute abnormality.  Brain: Motion degraded imaging, limiting diagnostic sensitivity and specificity.  There is indistinct gray-white junction multiple levels, not entirely explained by motion. Deep gray nuclei are visible and symmetric. No hemorrhage, hydrocephalus, or  shift.  IMPRESSION: 1. Limited motion degraded study. 2. Poor gray-white differentiation, possible cytotoxic edema from hypoxic ischemic injury. 3. No intracranial hemorrhage or herniation.   Electronically Signed   By: Marnee Spring M.D.   On: 04/14/2015 12:03   Dg Chest Port 1 View  04/14/2015   CLINICAL DATA:  Respiratory failure  EXAM: PORTABLE CHEST - 1 VIEW  COMPARISON:  April 13, 2015  FINDINGS: The heart size and mediastinal contours are stable. The heart size enlarged. Aorta is tortuous. Endotracheal tube is identified distal tip 4.5 cm from carina. Right central venous line is identified with distal tip probably in the right atrium. There is pulmonary edema. This probably a small pleural effusion. Patchy opacities identified in bilateral medial lung bases probably due to atelectasis. The visualized skeletal structures are stable.  IMPRESSION: Cardiomegaly with pulmonary edema.   Electronically Signed   By: Sherian Rein M.D.   On: 04/14/2015 07:29    EKG:   Orders placed or performed during the hospital encounter of 04/13/15  . EKG 12-Lead  . EKG 12-Lead    ASSESSMENT AND PLAN:   1) shock: cardiogenic, hemorhagic and septic - appreciate critical care following - Pressors discontinued this morning with good blood pressure response  2) cardiac arrest - appreciate cardiology consultation - likely due to sepsis  3) respiratory failure - full ventilator support - CXR with cardiomegaly and edema  4) encephalopathy - neurology following, CT suggestive of anoxic brain injury, EEG suggestive of metabolic toxic encephalopathy - not requiring sedation for ventilation - After greater than 48 hours with no improvement in neurologic status it is becoming less likely that she will regain function  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, had been on high dose lovenox after surgery, only one dose of coumadin prior to admission - fibrinogen  elevated, plt's slightly low, not DIC, though likely related to sepsis  6) gi bleeding/history of PUD - Dr Shelle Iron feels NG bloody output more likely due to NG tube trauma - continue protonix infusion - Now with dark red blood per rectum, I suspect this is due to ischemic/necrotic bowel  7) sepsis and g - bacteremia - g - rod bacteremia, covering with zosyn, sensitivities pending - WBC increasing - likely GI source  8) acute renal failure - Cr improving slowly, UOP improving   9) acute systolic congestive heart failure - ECHO with ef 35%, no prior history of CHF - if survives will need further work up  10)  gastric bypass postop day 8 - concern that drainage from JP drain represents ischemic bowel. Dr. Smitty Cords to possibly perform laparoscopic procedure today to assess.  All the records are reviewed and case discussed with the patient's daughter Synetta Fail and son in Social worker. Also discussed with Dr. Belia Heman, Dr. Katrinka Blazing and Dr. Orvan Falconer, palliative care.   CODE STATUS: Limited  TOTAL CRITICAL CARE TIME TAKING CARE OF THIS PATIENT: 35 minutes.  Greater than 50% of time spent in care coordination and counseling. Overall prognosis poor.  Elby Showers M.D on 04/15/2015 at 3:53 PM  Between 7am to 6pm - Pager - [phone]  After 6pm go to [email or web address] - password [email or web address] [facility]  [place] [facility]  Office  [phone]  CC: Primary care physician; [name], MD

## 2015-04-15 NOTE — Progress Notes (Signed)
NEUROLOGY NOTE  S: today off pressors but not much clinical changes per nursing.  ROS unobtainable secondary to mental status  O: 98.7   104*72   85   18 Obese, moderate distress Normocephalic, oropharynx clear Supple, nl JVD CTA B but there is some wheezing from trachia RRR, no murmur No C/C/E  Intubated, not sedated, opens to pain but does not track or follow,  GCS 5T PERRLA, + corneals B, slight L gaze, good cough Weak flexion B to pain  EEG with moderate slowing and triphasic,  Decent reactivity  A/P: 1. Encephalopathy-  On eeg this appears to be more toxic/metabolic and infectious related;  The typical pattern of burst suppression with anoxic injury is not seen.  Pt has had trace improvement in exam and is now off of all pressors. -  Repeat CT pending -  Surgery will likely look for ischemic gut, if CT ok -  If either ischemic gut or anoxic injury on CT are present, would highly consider comfort care -  Would start TPN if above negative -  Can not start neurostimulation without PO route -  Suggested to family that pt get a few more days to see what she does  -  Will follow

## 2015-04-15 NOTE — Progress Notes (Signed)
Palliative Medicine Inpatient Consult Follow Up Note   Name: Paige [name] Date: 04/15/2015 MRN: [id number]  DOB: November 03, 1950  Referring Physician: Gale Journey, MD  Palliative Care consult requested for this 64 y.o. female for goals of medical therapy in patient with poor neurologic state following CPR / ACLS in an ambulance following cardiopulmonary arrest (that occurred in the ambulance).    TODAY'S CONVERSATIONS, EVENTS, AND PLAN: 1.  Limited code status continues. 2.  More time needs to pass to determine direction pt will be going in (improvement is not expected, but it is important to be certain). 3.  Surgery may take place if CT of head does not show obvious signs of severe brain injury 4.  I will follow daily but not necessarily talk with family daily --until it is apparent that palliative discussions are needed --or not needed.  I have talked with Dr. Sung Amabile, Critical Care MD, and with nursing today.     CODE STATUS: Limited code   PAST MEDICAL HISTORY: Past Medical History  Diagnosis Date  . Blood clot in vein 01/2012  . Arthritis   . Heart disease   . Hemorrhoids   . Hypertension   . GERD (gastroesophageal reflux disease)   . Peptic ulcer disease     PAST SURGICAL HISTORY:  Past Surgical History  Procedure Laterality Date  . Esophagogastroduodenoscopy N/A 01/08/2015    Procedure: ESOPHAGOGASTRODUODENOSCOPY (EGD);  Surgeon: Scot Jun, MD;  Location: [facility] ENDOSCOPY;  Service: Endoscopy;  Laterality: N/A;  . Laparoscopy      Bariatric Sx 8/22  . Other surgical history      IVC Filter    Vital Signs: BP 120/70 mmHg  Pulse 85  Temp(Src) 99.7 F (37.6 C) (Rectal)  Resp 15  Ht  (1.6 m)  Wt 178.3 kg (393 lb 1.3 oz)  BMI 69.65 kg/m2  SpO2 98% Filed Weights   04/13/15 1500 04/15/15 0500  Weight: 173 kg (381 lb 6.3 oz) 178.3 kg (393 lb 1.3 oz)    Estimated body mass index is 69.65 kg/(m^2) as calculated from the following:   Height as of this  encounter:  (1.6 m).   Weight as of this encounter: 178.3 kg (393 lb 1.3 oz).  PHYSICAL EXAM: Intubated and NOT sedated She is off of pressors now with good BP ABX continue Pupils are equal and sluggishly reactive with less deviation of right eye today than last evening (still some is evident) No JVD or TM Heart rrr no mgr Lungs with vent sounds, cta Abd obese with JP drain --I don't hear BS Ext obese no mottling of skin noted. Neuro --no response to pain on my exam (she reportedly flinched just a bit to pain for neurologist exam this am --per nursing)  LABS: CBC:    Component Value Date/Time   WBC 18.6* 04/15/2015 0446   WBC 5.8 08/02/2014 1312   HGB 9.4* 04/15/2015 0446   HGB 11.5* 08/02/2014 1312   HCT 28.6* 04/15/2015 0446   HCT 36.5 08/02/2014 1312   PLT 95* 04/15/2015 0446   PLT 216 08/30/2014 1025   MCV 80.0 04/15/2015 0446   MCV 83 08/02/2014 1312   NEUTROABS 2.9 12/30/2014 1706   NEUTROABS 3.9 08/02/2014 1312   LYMPHSABS 1.4 12/30/2014 1706   LYMPHSABS 1.3 08/02/2014 1312   MONOABS 0.3 12/30/2014 1706   MONOABS 0.3 08/02/2014 1312   EOSABS 0.2 12/30/2014 1706   EOSABS 0.2 08/02/2014 1312   BASOSABS 0.0 12/30/2014 1706  BASOSABS 0.0 08/02/2014 1312   Comprehensive Metabolic Panel:    Component Value Date/Time   NA 143 04/15/2015 0446   NA 139 08/31/2011 0733   K 3.1* 04/15/2015 0446   K 3.8 08/31/2011 0733   CL 113* 04/15/2015 0446   CL 107 08/31/2011 0733   CO2 23 04/15/2015 0446   CO2 25 08/31/2011 0733   BUN 65* 04/15/2015 0446   BUN 10 08/31/2011 0733   CREATININE 1.94* 04/15/2015 0446   CREATININE 0.74 08/02/2014 1312   GLUCOSE 146* 04/15/2015 0446   GLUCOSE 77 08/31/2011 0733   CALCIUM 7.8* 04/15/2015 0446   CALCIUM 9.0 08/31/2011 0733   AST 48* 04/15/2015 0446   ALT 49 04/15/2015 0446   ALKPHOS 62 04/15/2015 0446   BILITOT 2.0* 04/15/2015 0446   PROT 5.6* 04/15/2015 0446   ALBUMIN 1.8* 04/15/2015 0446    IMPRESSION: 1. Apparent  anoxic brain injury / metabolic encephalopathy / severe brain injury ---cerebral edema noted on CT head --likely due to hypoxic ischemic brain injury ---likely occurred during code blue with 15 min of downtime and repeated epinephrine doses ---Neurology has evaluated EEG which did not show status but did show metabolic slowing ---EEG did not show 'typical burst suppression pattern' which is usually seen with anoxic injury.  ---degree of anoxic brain injury seems to be severe (and a bit more than one would expect given details of the resuscitative effort).  Hence, there is a very cautious approach to possible terminal extubaton --she will need several more days to see if this reverses/ improves . ---An EEG is pending as per neurologist.  2. Post Gastric Bypass procedure 04/07/15  ---with post-operative complication of purulent drainage from JP drain ---with subsequent development of sepsis which precipitated the cardiopulmonary arrest ---Dr Smitty Cords has talked with Dr. Sung Amabile about a possible surgical intervention as she likely has ischemic bowel. This may be pursued if her head CT is not showing severe anoxia/ herniation, etc.   3. Shock ---due to sepsis and also cardiogenic ---bacteremia noted (GNR in bl cxs ) ---continues on pressors (levophed and vasopressin) 4. S/P Cardiac arrest 5. Acute respiratory failure --on full vent support 6. Coagulopathy 7. Acute systolic CHF  ---EF =35% with no known prior heart history 8. Acute Renal Failure 9. Elevated bilirubin 10. Hypoalbuminemia 11. Anemia of unclear etiology 12. Leukocytosis 13. Metabolic acidosis 14. Cardiomegaly and pulmonary edema 15.  Gross (red blood per rectum today (small amt per nursing).   See top of note for plan.  More than 50% of the visit was spent in counseling/coordination of care: YES  Time Spent:  25 min

## 2015-04-15 NOTE — Progress Notes (Signed)
[facility]  ROUNDING NOTE   Subjective:  Renal function actually better. Pt still critically ill and on vent. UOP was 3.5 liters over the past 3 shifts.    Objective:  Vital signs in last 24 hours:  Temp:  [99 F (37.2 C)-99.7 F (37.6 C)] 99.7 F (37.6 C) (08/30 0700) Pulse Rate:  [72-205] 85 (08/30 0700) Resp:  [15-20] 15 (08/30 0700) BP: (96-125)/(64-86) 120/70 mmHg (08/30 0700) SpO2:  [95 %-99 %] 98 % (08/30 0700) Arterial Line BP: (91-115)/(53-63) 115/63 mmHg (08/30 0700) FiO2 (%):  [45 %] 45 % (08/30 0716) Weight:  [178.3 kg (393 lb 1.3 oz)] 178.3 kg (393 lb 1.3 oz) (08/30 0500)  Weight change: 5.3 kg (11 lb 11 oz) Filed Weights   04/13/15 1500 04/15/15 0500  Weight: 173 kg (381 lb 6.3 oz) 178.3 kg (393 lb 1.3 oz)    Intake/Output: I/O last 3 completed shifts: In: 1086.8 [I.V.:1036.8; IV Piggyback:50] Out: 4160 [Urine:3575; Drains:585]   Intake/Output this shift:     Physical Exam: General: Critically ill appearing   Head: Normocephalic, ETT in place  Eyes: Eyes closed  Neck: Supple, trachea midline  Lungs:  Clear to auscultation vent assisted  Heart: S1S2 no rubs  Abdomen:  Soft, distended, abdominal tube noted to bein place  Extremities:  trace peripheral edema  Neurologic: Obtunded at present  Skin: No lesions       Basic Metabolic Panel:  Recent Labs Lab 04/13/15 0625 04/13/15 1130 04/13/15 2157 04/14/15 0504 04/15/15 0446  NA 145 138 141 142 143  K 4.7 4.4 3.7 3.8 3.1*  CL 108 108 110 110 113*  CO2 21* 20* 21* 21* 23  GLUCOSE 189* 164* 141* 138* 146*  BUN 78* 72* 75* 78* 65*  CREATININE 4.62* 3.68* 3.48* 3.18* 1.94*  CALCIUM 7.9* 6.9* 7.5* 7.7* 7.8*  MG 2.7*  --   --   --   --     Liver Function Tests:  Recent Labs Lab 04/13/15 1130 04/14/15 0504 04/15/15 0446  AST 179* 85* 48*  ALT 90* 62* 49  ALKPHOS 64 53 62  BILITOT 1.8* 3.2* 2.0*  PROT 5.6* 5.9* 5.6*  ALBUMIN 1.8* 2.0* 1.8*   No results for input(s):  LIPASE, AMYLASE in the last 168 hours. No results for input(s): AMMONIA in the last 168 hours.  CBC:  Recent Labs Lab 04/14/15 0504 04/14/15 1257 04/14/15 1513 04/14/15 2321 04/15/15 0446  WBC 17.5* 18.4* 18.2* 17.5* 18.6*  HGB 11.2* 10.3* 10.3* 9.5* 9.4*  HCT 34.7* 31.5* 31.5* 29.1* 28.6*  MCV 79.4* 79.5* 79.5* 79.4* 80.0  PLT 146* 126* 121* 95* 95*    Cardiac Enzymes:  Recent Labs Lab 04/13/15 0625 04/13/15 1130 04/13/15 1718 04/13/15 2157 04/14/15 0504  TROPONINI 0.04* 0.03 <0.03 <0.03 0.03    BNP: Invalid input(s): POCBNP  CBG:  Recent Labs Lab 04/14/15 1711 04/14/15 1955 04/15/15 0022 04/15/15 0408 04/15/15 0742  GLUCAP 145* 133* 119* 144* 125*    Microbiology: Results for orders placed or performed during the hospital encounter of 04/13/15  MRSA PCR Screening     Status: Abnormal   Collection Time: 04/13/15 11:30 AM  Result Value Ref Range Status   MRSA by PCR POSITIVE (A) NEGATIVE Final    Comment: [facility] CALLED CHRISTINA MILES AT 1740 04/13/15        The GeneXpert MRSA Assay (FDA approved for NASAL specimens only), is one component of a comprehensive MRSA colonization surveillance program. It is not intended to diagnose  MRSA infection nor to guide or monitor treatment for MRSA infections.   Culture, blood (routine x 2)     Status: None (Preliminary result)   Collection Time: 04/13/15 12:45 PM  Result Value Ref Range Status   Specimen Description BLOOD LEFT HAND  Final   Special Requests   Final    BOTTLES DRAWN AEROBIC AND ANAEROBIC  AER 2CC ANA 1CC   Culture  Setup Time   Final    GRAM NEGATIVE RODS AEROBIC BOTTLE ONLY CRITICAL RESULT CALLED TO, READ BACK BY AND VERIFIED WITH: Cicero Duck TAYLOR 04/14/2015 0515 [facility] CONFIRMED BY [facility]    Culture   Final    GRAM NEGATIVE RODS AEROBIC BOTTLE ONLY IDENTIFICATION AND SUSCEPTIBILITIES TO FOLLOW    Report Status PENDING  Incomplete  Culture, blood (routine x 2)     Status: None (Preliminary  result)   Collection Time: 04/13/15  1:05 PM  Result Value Ref Range Status   Specimen Description BLOOD RIGHT HAND  Final   Special Requests BOTTLES DRAWN AEROBIC AND ANAEROBIC  4CC  Final   Culture  Setup Time   Final    GRAM NEGATIVE RODS IN BOTH AEROBIC AND ANAEROBIC BOTTLES CRITICAL RESULT CALLED TO, READ BACK BY AND VERIFIED WITH: ERIKA TAYLOR 04/14/2015 0445 [facility]    Culture   Final    GRAM NEGATIVE RODS IN BOTH AEROBIC AND ANAEROBIC BOTTLES IDENTIFICATION AND SUSCEPTIBILITIES TO FOLLOW CONFIRMED BY [facility]    Report Status PENDING  Incomplete    Coagulation Studies:  Recent Labs  04/13/15 1130 04/13/15 1718 04/13/15 2344 04/14/15 0504 04/15/15 0446  LABPROT 84.1* 22.2* 20.7* 20.1* 17.6*  INR 10.89* 1.93 1.76 1.69 1.43    Urinalysis:  Recent Labs  04/13/15 1408  COLORURINE AMBER*  LABSPEC 1.019  PHURINE 5.0  GLUCOSEU NEGATIVE  HGBUR 2+*  BILIRUBINUR NEGATIVE  KETONESUR NEGATIVE  PROTEINUR 100*  NITRITE NEGATIVE  LEUKOCYTESUR NEGATIVE      Imaging: Dg Chest 1 View  04/13/2015   CLINICAL DATA:  Line placement  EXAM: CHEST  1 VIEW  COMPARISON:  04/13/2015  FINDINGS: Endotracheal tube has been advanced with the tip now 3 cm above the carina. Right central line is in place with the tip at the cavoatrial junction. No pneumothorax. There is cardiomegaly with vascular congestion. Low lung volumes with bibasilar opacities, likely atelectasis.  IMPRESSION: Right central line tip at the cavoatrial junction.  No pneumothorax.  Cardiomegaly with vascular congestion.  Low lung volumes with bibasilar atelectasis.   Electronically Signed   By: Charlett Nose M.D.   On: 04/13/2015 11:40   Dg Abd 1 View  04/13/2015   CLINICAL DATA:  Gastric bypass history. Abdominal distention. GI bleed.  EXAM: ABDOMEN - 1 VIEW  COMPARISON:  05/03/2007  FINDINGS: Nonobstructive bowel gas pattern. No organomegaly or suspicious calcification definitively visualized. Study somewhat limited by body  habitus and portable nature of the study. No visible free air.  IMPRESSION: No evidence of bowel obstruction or free air.  Limited study.   Electronically Signed   By: Charlett Nose M.D.   On: 04/13/2015 11:38   Ct Head Wo Contrast  04/14/2015   CLINICAL DATA:  Cardiac arrest yesterday with encephalopathy.  EXAM: CT HEAD WITHOUT CONTRAST  TECHNIQUE: Contiguous axial images were obtained from the base of the skull through the vertex without intravenous contrast.  COMPARISON:  None.  FINDINGS: Skull and Sinuses:Negative for fracture or destructive process.  Fluid levels in the sphenoid sinuses, likely from intubation status.  Orbits: No acute abnormality.  Brain: Motion degraded imaging, limiting diagnostic sensitivity and specificity.  There is indistinct gray-white junction multiple levels, not entirely explained by motion. Deep gray nuclei are visible and symmetric. No hemorrhage, hydrocephalus, or shift.  IMPRESSION: 1. Limited motion degraded study. 2. Poor gray-white differentiation, possible cytotoxic edema from hypoxic ischemic injury. 3. No intracranial hemorrhage or herniation.   Electronically Signed   By: Marnee Spring M.D.   On: 04/14/2015 12:03   Dg Chest Port 1 View  04/14/2015   CLINICAL DATA:  Respiratory failure  EXAM: PORTABLE CHEST - 1 VIEW  COMPARISON:  April 13, 2015  FINDINGS: The heart size and mediastinal contours are stable. The heart size enlarged. Aorta is tortuous. Endotracheal tube is identified distal tip 4.5 cm from carina. Right central venous line is identified with distal tip probably in the right atrium. There is pulmonary edema. This probably a small pleural effusion. Patchy opacities identified in bilateral medial lung bases probably due to atelectasis. The visualized skeletal structures are stable.  IMPRESSION: Cardiomegaly with pulmonary edema.   Electronically Signed   By: Sherian Rein M.D.   On: 04/14/2015 07:29     Medications:   . dextrose 50 mL/hr at 04/15/15  0157  . norepinephrine (LEVOPHED) Adult infusion 6 mcg/min (04/14/15 1828)  . pantoprozole (PROTONIX) infusion 8 mg/hr (04/15/15 0218)  . vasopressin (PITRESSIN) infusion - *FOR SHOCK* 0.03 Units/min (04/14/15 1319)   . sodium chloride  10 mL/hr Intravenous Once  . antiseptic oral rinse  7 mL Mouth Rinse QID  . chlorhexidine gluconate  15 mL Mouth Rinse BID  . Chlorhexidine Gluconate Cloth  6 each Topical Q0600  . hydrocortisone sod succinate (SOLU-CORTEF) inj  50 mg Intravenous Q6H  . Influenza vac split quadrivalent PF  0.5 mL Intramuscular Tomorrow-1000  . insulin aspart  0-20 Units Subcutaneous 6 times per day  . lacosamide (VIMPAT) IV  50 mg Intravenous Q12H  . mupirocin ointment  1 application Nasal BID  . [START ON 04/16/2015] pantoprazole (PROTONIX) IV  40 mg Intravenous Q12H  . phytonadione (VITAMIN K) IV  10 mg Intravenous Daily  . piperacillin-tazobactam (ZOSYN)  IV  3.375 g Intravenous 3 times per day  . pneumococcal 23 valent vaccine  0.5 mL Intramuscular Tomorrow-1000  . sodium chloride  3 mL Intravenous Q12H  . thiamine IV  100 mg Intravenous Daily   fentaNYL (SUBLIMAZE) injection, LORazepam, sodium chloride  Assessment/ Plan:  64 y.o. female with a PMHX of morbid obesity, DVT, PE, IVC filter, GERD, recent gastric bypass procedure on 8/22 at [facility], was admitted on 04/13/2015 with cardiac arrest, acute renal failure, severe coagulopathy.  1. Acute renal failure due to ATN . Baseline creatinine 0.6 on August 22 -Excellent UOP noted, Cr also declining, no indication for HD.  Continue supportive care with IVFs and pressors.  2. Acute respiratory failure - continue ventilator support at this time, pulm/cc following.  3. Septic shock: gram negative rods still growing, no further information at this time, continue zosyn.  4. Recent LAPAROSCOPIC BILIOPANCREATIC DIVERSION, DUODENAL SWITCH AND HIATAL HERNIA REPAIR   5.  Hypokalemia:  Will give KCL IV x one.    LOS: 2 Spenser Cong 8/30/20169:50 AM

## 2015-04-15 NOTE — Clinical Documentation Improvement (Signed)
Hospitalist  Abnormal diagnostic findings (MRI scans, CT scans, tec.) are not coded and reported unless the physician indicates their clinical significance.  Possible Clinical Conditions:  Cerebral Edema, Cytoxic Edema, Vasogenic Edema, including suspected or known cause and associated condition(s).  Herniation, including type - uncal, transtentorial, or other type of herniation, including suspected or known cause and associated condition(s).  Other  Clinically Undetermined  Document any associated diagnoses/conditions.   Supporting Information: 8/29:  CT head reveals: . Poor gray-white differentiation, possible cytotoxic edema from hypoxic ischemic injury.   Please exercise your independent, professional judgment when responding. A specific answer is not anticipated or expected.   Thank You,  Rodman Pickle [facility] Information Management [place]

## 2015-04-15 NOTE — Op Note (Signed)
Preoperative diagnosis: Abnormal drainage from the JP; possible sepsis Postoperative diagnosis: Anastomotic dehiscence with intra-abdominal abscesses Procedure: Diagnostic laparoscopy/exploratory laparotomy with drainage of abscesses repair of anastomotic dehiscence; feeding jejunostomy and repair of serous tear of small bowel Surgeon: Smitty Cords Asst.: Lundquist Specimens: None Complications: None Blood loss: None Findings: Subhepatic abscesses bilaterally with drainage freely flowing into Jackson-Pratt drain out through the left and anastomotic dehiscence Drains: 19 French Blake drains 4; right upper subdiaphragmatic; right lower subhepatic and near the anastomosis; left upper left subdiaphragmatic; left lower crossed abdomen to right flank  Clinical history: The patient is a 64 year old female who underwent duodenal switch 8 days ago. 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 She has been on Lovenox window since. She is operated on [facility] and was discharged home postop day 3 in completely stable condition. At that time she had no complaints and had a drain that had serous sanguinous discharge. She had an intraoperative and postoperatively test which were both negative for leak She was discharged home with the drain in place. She was instructed to follow-up with her PCP the day after discharge for an evaluation of her PT INR and PT T. She is also discharged on a higher dose of Lovenox than what she had been taking baseline renal clearance per pharmacy recommendations and [facility]. She unfortunately could not make that follow-up and the following day postoperative day #5 I received a phone call saying that the patient was short of breath. The patient stated that she was fine. I discussed the situation with her daughter and recommended she consider taking the person to the emergency room where she thought she was fine just keep a close eye on her and if she  worsened take her to the emergency room. At that time she stated that she was sleeping. She told me later that the patient woke up and was using the spirometer and axial pinning was getting better.  The following morning the patient stated that she felt something was wrong prior daughter and they called an ambulance. The patient was transferring to a stretcher for the ambulance and at that point suffered cardiac arrest. She underwent resuscitation to include CPR and epinephrine injections en route to the hospital. She was resuscitated and converted in the emergency room. I was called that morning by the emergency room doctor she stated that a gastric or nasogastric tube was inserted and a Black coffee grounds out. They also said she had a hemoglobin of 9. Her PTT at that time was around 130 and there is concern that she could have an active bleed. At no time prior to admission. The patient complained of abdominal pain. That is per her daughter's recollection. She also did not have a fever. The patient was admitted to the ICU and had no neurologic function according to the staff. This was on August 28. I evaluated the patient on that day and on deep tissue wrap on the left side she would move her head to the left and move her arm as well. She was on pressors at that time and considered unstable to go for CT.  She subsequently underwent echocardiography which showed a left ventricular ejection fraction reduced to 36% with global dilatation of the left ventricle . The patient had no prior cardiac history prior daughter or by her own account. She also had gram-negative rods in her blood that grew out a blood culture on admission. That said once resuscitated she remained  afebrile and normotensive and without tachycardia. Each time I saw her her heart rate was between 80s and 90s. On the 29th she also had a CT scan which by the neurologist was extremely low-quality and mentioned motion artifact despite the fact that  the patient was not moving.  The question whether or not there was tissue changes at the gray white interface but the neurologist didn't feel that they could make that call based on the low-quality image that they had at that time. The question at this time for follow-up S was whether or not the patient was brain dead from a catastrophic anoxic injury. Palliative care M.D. had seen her as well and thought that this was likely the case. This was again on the 29th. I spoke with the family and the plan was to get an EEG and a repeat CT scan on the 30th. At this time I had a little bit the patient had brain function based on what I was told. No further therapy seemed warranted given the fact that she was likely brain dead. At that time her drainage also had changed. At home her daughter stated that the drainage was different than what started draining in the ICU. I am not sure exactly when that transition to place but when I saw her on the 28th the drainage was serosanguineous to dark serosanguineous. On the 29th was noted to be foul-smelling. I was concerned that this may represent ischemic bowel associated with the cardiac arrest. If that is the case it was likely a catastrophic insult unto itself. I again spoke with the family about that finding and said that we would have to be sure she was not brain dead before operating on her abdomen.  Today spoke with several consultants including the neurologist on the phone with said that she indeed had brain activity and then her EEG. This to be encephalopathic. A repeat CT scan today showed no significant infarcts. This is 48 hours after the initial event. I spoke with the family and told them that if the CT scan was negative for infarct then we should look at her abdomen to make sure there is not something we can treat. At that time I was still taking ischemic bowel and told them if all the bowel was infarcted that we would not be able to do anything for her. I  scheduled her for surgery in the operating room was kind enough to get her in in a reasonable time.  Details of procedure: The patient was taken to the operating room and placed on the operating table in the supine position. Appropriate monitors were already in place and she was placed under anesthesia. The abdomen was prepped and draped in usual sterile fashion and the old drain was removed. A laparoscope was placed through a 5 mm optical trocar in the mid to lower abdomen and the abdomen was entered without difficulty. This was using optical trocar technique. Insufflation occurred and we placed the scope and immediately saw gross contamination in the abdominal cavity. We then converted to exploratory laparotomy and a midline incision was taken from the xiphoid to below the umbilicus. The layers of the abdominal wall were opened sequentially using cautery without injury to the underlying bowel. Upon entering the abdomen it was evident that there was an anastomotic dehiscence anteriorly. This was closed using a running 2-0 Vicryl suture and revised a little bit later with another running 2-0 suture. Abscesses were present in the subhepatic fossa on both  left and right knees were drained as well as subhepatic fossa. These were washed out copiously this with large amounts of saline warmth. Interestingly enough the spaces between the bowel had no abscess and there were no contamination inferiorly. Most of the drainage was in the upper abdomen draining out the left foot where the drain was. These were all completely evacuated and debrided as best as possible. Decision was made to place a feeding takes an ostomy for feeding tube access postoperatively. Ligament of Treitz was identified and about 30 cm distal to it and the bilopancreatic limb a Witzel jejunostomy was created in usual fashion. A 2-0 Vicryl pursestring suture was created and enterotomy was created in the middle of it. A 14 French red rubber catheter was  inserted as far as we can get it after was brought through the abdominal wall. The tip of that have been amputated to allow free flow edge of material. A Witzel tunnel was then created by using interrupted 30 Vicryls to create the tunnel. 2 sutures were used to approximate the jejunostomy to the abdominal wall.  A small serous tear was present very distended loops of bowel which was closed using running 2-0 Vicryl sutures. Drains were then placed in all the fossa as noted superiorly in this discussion. The eminence gallop 5 and the fascia closed using #1 PDS suture. The subcutaneous was irrigated had minimal contamination was closed using staples. Dressings were applied. The jejunostomy tube was secured using an 2-0 silk pursestring suture. The JPs were secured using 3-0 nylon drain sutures.  The patient tolerated procedure well and arrived at the intensive care unit in stable condition.

## 2015-04-15 NOTE — Procedures (Signed)
Central Venous Catheter Insertion Procedure Note Paige [name] [id number] 1950/08/29  Procedure: Insertion of Central Venous Catheter Indications: Drug and/or fluid administration  Procedure Details Consent: Unable to obtain consent because of altered level of consciousness. and medical necesity Procedure performed as part of operation performed by Dr. Smitty Cords. Time Out: Verified patient identification, verified procedure, site/side was marked, verified correct patient position, special equipment/implants available, medications/allergies/relevent history reviewed, required imaging and test results available.  Performed  Maximum sterile technique was used including antiseptics, cap, gloves, gown, hand hygiene, mask and sheet. Skin prep: Chlorhexidine; local anesthetic administered A antimicrobial bonded/coated triple lumen catheter was placed in the right internal jugular vein using the Seldinger technique.  Evaluation Blood flow good Complications: No apparent complications Patient did tolerate procedure well. Chest X-ray ordered to verify placement.  CXR: pending.  Paige [name] 04/15/2015, 8:11 PM

## 2015-04-15 NOTE — Progress Notes (Signed)
   04/15/15 0951  Clinical Encounter Type  Visited With Family  Visit Type Spiritual support  Consult/Referral To Chaplain  Spiritual Encounters  Spiritual Needs Emotional  Stress Factors  Family Stress Factors None identified  Chaplain introduced self to family in hallway and offered a compassionate presence and support as applicable, Inquired as to the family member and they asked to keep her in prayer. Chaplain Banks Chaikin A. Talaya Lamprecht Ext. [phone]

## 2015-04-16 ENCOUNTER — Inpatient Hospital Stay: Payer: BLUE CROSS/BLUE SHIELD

## 2015-04-16 LAB — COMPREHENSIVE METABOLIC PANEL
ALBUMIN: 1.5 g/dL — AB (ref 3.5–5.0)
ALK PHOS: 71 U/L (ref 38–126)
ALT: 52 U/L (ref 14–54)
ANION GAP: 4 — AB (ref 5–15)
AST: 66 U/L — ABNORMAL HIGH (ref 15–41)
BILIRUBIN TOTAL: 3.9 mg/dL — AB (ref 0.3–1.2)
BUN: 59 mg/dL — AB (ref 6–20)
CALCIUM: 7.3 mg/dL — AB (ref 8.9–10.3)
CO2: 25 mmol/L (ref 22–32)
CREATININE: 1.55 mg/dL — AB (ref 0.44–1.00)
Chloride: 116 mmol/L — ABNORMAL HIGH (ref 101–111)
GFR calc Af Amer: 40 mL/min — ABNORMAL LOW (ref >60)
GFR calc non Af Amer: 34 mL/min — ABNORMAL LOW (ref >60)
GLUCOSE: 103 mg/dL — AB (ref 65–99)
Potassium: 5.3 mmol/L — ABNORMAL HIGH (ref 3.5–5.1)
Sodium: 145 mmol/L (ref 135–145)
TOTAL PROTEIN: 5.2 g/dL — AB (ref 6.5–8.1)

## 2015-04-16 LAB — CBC
HCT: 30.7 % — ABNORMAL LOW (ref 35.0–47.0)
HEMOGLOBIN: 9.5 g/dL — AB (ref 12.0–16.0)
MCH: 26.3 pg (ref 26.0–34.0)
MCHC: 31.1 g/dL — AB (ref 32.0–36.0)
MCV: 84.4 fL (ref 80.0–100.0)
PLATELETS: 69 10*3/uL — AB (ref 150–440)
RBC: 3.63 MIL/uL — ABNORMAL LOW (ref 3.80–5.20)
RDW: 19.4 % — AB (ref 11.5–14.5)
WBC: 22.2 10*3/uL — ABNORMAL HIGH (ref 3.6–11.0)

## 2015-04-16 LAB — CULTURE, BLOOD (ROUTINE X 2)

## 2015-04-16 LAB — GLUCOSE, CAPILLARY
GLUCOSE-CAPILLARY: 92 mg/dL (ref 65–99)
GLUCOSE-CAPILLARY: 93 mg/dL (ref 65–99)
Glucose-Capillary: 107 mg/dL — ABNORMAL HIGH (ref 65–99)
Glucose-Capillary: 98 mg/dL (ref 65–99)
Glucose-Capillary: 98 mg/dL (ref 65–99)
Glucose-Capillary: 96 mg/dL (ref 65–99)
Glucose-Capillary: 101 mg/dL — ABNORMAL HIGH (ref 65–99)

## 2015-04-16 LAB — PROTIME-INR
INR: 1.69
PROTHROMBIN TIME: 20.1 s — AB (ref 11.4–15.0)

## 2015-04-16 MED ORDER — FENTANYL CITRATE (PF) 100 MCG/2ML IJ SOLN
25.0000 ug | INTRAMUSCULAR | Status: DC | PRN
Start: 2015-04-16 — End: 2015-04-17

## 2015-04-16 MED ORDER — DEXTROSE 5 % IV SOLN
INTRAVENOUS | Status: DC
  Administered 2015-04-16: 21:00:00 via INTRAVENOUS

## 2015-04-16 MED ORDER — DIGOXIN 0.25 MG/ML IJ SOLN
0.2500 mg | Freq: Every day | INTRAMUSCULAR | Status: DC
  Administered 2015-04-16: 0.25 mg via INTRAVENOUS
  Filled 2015-04-16: qty 2

## 2015-04-16 MED ORDER — ONDANSETRON HCL 4 MG/2ML IJ SOLN: 4.0000 mg | Freq: Once | INTRAMUSCULAR | Status: DC | PRN

## 2015-04-16 MED ORDER — NOREPINEPHRINE 4 MG/250ML-% IV SOLN
0.0000 ug/min | INTRAVENOUS | Status: DC
  Administered 2015-04-16: 2 ug/min via INTRAVENOUS
  Administered 2015-04-16: 3 ug/min via INTRAVENOUS
  Filled 2015-04-16 (×2): qty 250

## 2015-04-16 MED ORDER — ACETAMINOPHEN 650 MG RE SUPP: 650.0000 mg | RECTAL | Status: DC | PRN

## 2015-04-16 MED ORDER — NOREPINEPHRINE BITARTRATE 1 MG/ML IV SOLN: 0.0000 ug/min | INTRAVENOUS | Status: DC

## 2015-04-16 NOTE — Progress Notes (Signed)
Surgery Progress Note  Seeing at request of Dr. Smitty Cords  S: No acute issues O:Blood pressure 118/71, pulse 109, temperature 99.9 F (37.7 C), temperature source Core (Comment), resp. rate 21, height  (1.6 m), weight 393 lb 1.3 oz (178.3 kg), SpO2 95 %. GEN: NAD ABD: soft, JP with serous output  Labs WBC 22.2  A/P 64 yo s/p arrest, s/p ex lap with suture repair of anastamotic breakdown and washout - no acute surgical issues - will likely begin J tube feeds at 10 tomorrow

## 2015-04-16 NOTE — Progress Notes (Signed)
[facility]  ROUNDING NOTE   Subjective:  Renal function continues to improve. Cr down to 1.55.  UOP good at 2.7 liters.   Was taken to OR yesterday by Dr. Smitty Cords.  Objective:  Vital signs in last 24 hours:  Temp:  [98.1 F (36.7 C)-99.3 F (37.4 C)] 99.3 F (37.4 C) (08/31 0800) Pulse Rate:  [33-126] 97 (08/31 0800) Resp:  [14-22] 21 (08/31 0800) BP: (74-138)/(51-107) 104/56 mmHg (08/31 0800) SpO2:  [98 %-100 %] 100 % (08/31 0800) Arterial Line BP: (69-117)/(41-65) 97/49 mmHg (08/31 0800) FiO2 (%):  [40 %-45 %] 40 % (08/31 0349)  Weight change:  Filed Weights   04/13/15 1500 04/15/15 0500  Weight: 173 kg (381 lb 6.3 oz) 178.3 kg (393 lb 1.3 oz)    Intake/Output: I/O last 3 completed shifts: In: 3791.4 [I.V.:3431.4; Other:120; IV Piggyback:240] Out: 3400 [Urine:2700; Drains:700]   Intake/Output this shift:  Total I/O In: 60 [Other:60] Out: -   Physical Exam: General: Critically ill appearing   Head: Normocephalic, ETT in place  Eyes: Eyes closed  Neck: Supple, trachea midline  Lungs:  Clear to auscultation vent assisted  Heart: S1S2 no rubs  Abdomen:  Soft, distended, abdominal tube in place  Extremities:  1+ peripheral edema  Neurologic: Obtunded at present  Skin: No lesions       Basic Metabolic Panel:  Recent Labs Lab 04/13/15 0625 04/13/15 1130 04/13/15 2157 04/14/15 0504 04/15/15 0446 04/16/15 0547  NA 145 138 141 142 143 145  K 4.7 4.4 3.7 3.8 3.1* 5.3*  CL 108 108 110 110 113* 116*  CO2 21* 20* 21* 21* 23 25  GLUCOSE 189* 164* 141* 138* 146* 103*  BUN 78* 72* 75* 78* 65* 59*  CREATININE 4.62* 3.68* 3.48* 3.18* 1.94* 1.55*  CALCIUM 7.9* 6.9* 7.5* 7.7* 7.8* 7.3*  MG 2.7*  --   --   --   --   --     Liver Function Tests:  Recent Labs Lab 04/13/15 1130 04/14/15 0504 04/15/15 0446 04/16/15 0547  AST 179* 85* 48* 66*  ALT 90* 62* 49 52  ALKPHOS 64 53 62 71  BILITOT 1.8* 3.2* 2.0* 3.9*  PROT 5.6* 5.9* 5.6* 5.2*  ALBUMIN  1.8* 2.0* 1.8* 1.5*   No results for input(s): LIPASE, AMYLASE in the last 168 hours. No results for input(s): AMMONIA in the last 168 hours.  CBC:  Recent Labs Lab 04/14/15 1257 04/14/15 1513 04/14/15 2321 04/15/15 0446 04/16/15 0547  WBC 18.4* 18.2* 17.5* 18.6* 22.2*  HGB 10.3* 10.3* 9.5* 9.4* 9.5*  HCT 31.5* 31.5* 29.1* 28.6* 30.7*  MCV 79.5* 79.5* 79.4* 80.0 84.4  PLT 126* 121* 95* 95* 69*    Cardiac Enzymes:  Recent Labs Lab 04/13/15 0625 04/13/15 1130 04/13/15 1718 04/13/15 2157 04/14/15 0504  TROPONINI 0.04* 0.03 <0.03 <0.03 0.03    BNP: Invalid input(s): POCBNP  CBG:  Recent Labs Lab 04/15/15 1614 04/15/15 2047 04/16/15 0020 04/16/15 0448 04/16/15 0759  GLUCAP 114* 97 93 98 98    Microbiology: Results for orders placed or performed during the hospital encounter of 04/13/15  MRSA PCR Screening     Status: Abnormal   Collection Time: 04/13/15 11:30 AM  Result Value Ref Range Status   MRSA by PCR POSITIVE (A) NEGATIVE Final    Comment: [facility] CALLED CHRISTINA MILES AT 1740 04/13/15        The GeneXpert MRSA Assay (FDA approved for NASAL specimens only), is one component of a comprehensive MRSA  colonization surveillance program. It is not intended to diagnose MRSA infection nor to guide or monitor treatment for MRSA infections.   Culture, blood (routine x 2)     Status: None (Preliminary result)   Collection Time: 04/13/15 12:45 PM  Result Value Ref Range Status   Specimen Description BLOOD LEFT HAND  Final   Special Requests   Final    BOTTLES DRAWN AEROBIC AND ANAEROBIC  AER 2CC ANA 1CC   Culture  Setup Time   Final    GRAM NEGATIVE RODS AEROBIC BOTTLE ONLY CRITICAL RESULT CALLED TO, READ BACK BY AND VERIFIED WITH: ERIKA TAYLOR 04/14/2015 0515 [facility] CONFIRMED BY [facility]    Culture   Final    KLEBSIELLA PNEUMONIAE IN BOTH AEROBIC AND ANAEROBIC BOTTLES    Report Status PENDING  Incomplete   Organism ID, Bacteria KLEBSIELLA PNEUMONIAE  Final       Susceptibility   Klebsiella pneumoniae - MIC*    AMPICILLIN 16 RESISTANT Resistant     CEFTAZIDIME <=1 SENSITIVE Sensitive     CEFAZOLIN <=4 SENSITIVE Sensitive     CEFTRIAXONE <=1 SENSITIVE Sensitive     CIPROFLOXACIN <=0.25 SENSITIVE Sensitive     GENTAMICIN <=1 SENSITIVE Sensitive     IMIPENEM <=0.25 SENSITIVE Sensitive     TRIMETH/SULFA <=20 SENSITIVE Sensitive     PIP/TAZO Value in next row Sensitive      SENSITIVE<=4    * KLEBSIELLA PNEUMONIAE  Culture, blood (routine x 2)     Status: None   Collection Time: 04/13/15  1:05 PM  Result Value Ref Range Status   Specimen Description BLOOD RIGHT HAND  Final   Special Requests BOTTLES DRAWN AEROBIC AND ANAEROBIC  4CC  Final   Culture  Setup Time   Final    GRAM NEGATIVE RODS IN BOTH AEROBIC AND ANAEROBIC BOTTLES CRITICAL RESULT CALLED TO, READ BACK BY AND VERIFIED WITH: Cicero Duck TAYLOR 04/14/2015 0445 [facility]    Culture   Final    KLEBSIELLA PNEUMONIAE IN BOTH AEROBIC AND ANAEROBIC BOTTLES CONFIRMED BY [facility]    Report Status 04/16/2015 FINAL  Final   Organism ID, Bacteria KLEBSIELLA PNEUMONIAE  Final      Susceptibility   Klebsiella pneumoniae - MIC*    AMPICILLIN 16 RESISTANT Resistant     CEFTAZIDIME <=1 SENSITIVE Sensitive     CEFAZOLIN <=4 SENSITIVE Sensitive     CEFTRIAXONE <=1 SENSITIVE Sensitive     CIPROFLOXACIN <=0.25 SENSITIVE Sensitive     GENTAMICIN <=1 SENSITIVE Sensitive     IMIPENEM <=0.25 SENSITIVE Sensitive     TRIMETH/SULFA <=20 SENSITIVE Sensitive     PIP/TAZO Value in next row Sensitive      SENSITIVE<=4    * KLEBSIELLA PNEUMONIAE    Coagulation Studies:  Recent Labs  04/13/15 1718 04/13/15 2344 04/14/15 0504 04/15/15 0446 04/16/15 0800  LABPROT 22.2* 20.7* 20.1* 17.6* 20.1*  INR 1.93 1.76 1.69 1.43 1.69    Urinalysis:  Recent Labs  04/13/15 1408  COLORURINE AMBER*  LABSPEC 1.019  PHURINE 5.0  GLUCOSEU NEGATIVE  HGBUR 2+*  BILIRUBINUR NEGATIVE  KETONESUR NEGATIVE  PROTEINUR 100*   NITRITE NEGATIVE  LEUKOCYTESUR NEGATIVE      Imaging: Ct Head Wo Contrast  04/15/2015   CLINICAL DATA:  Cardiac arrest.  Altered mental status.  EXAM: CT HEAD WITHOUT CONTRAST  TECHNIQUE: Contiguous axial images were obtained from the base of the skull through the vertex without intravenous contrast.  COMPARISON:  04/14/2015  FINDINGS: As prior study, the study quality  is is limited and degraded by motion. It is difficult to completely exclude some edema within the cerebral hemispheres bilaterally due to motion degradation. Note definite convincing changes of infarct. No hemorrhage or hydrocephalus.  IMPRESSION: Motion degraded study. Note definite infarct, although the gray-white differentiation is difficult to evaluate in the cerebral hemispheres due to motion degradation.   Electronically Signed   By: Charlett Nose M.D.   On: 04/15/2015 14:44   Ct Head Wo Contrast  04/14/2015   CLINICAL DATA:  Cardiac arrest yesterday with encephalopathy.  EXAM: CT HEAD WITHOUT CONTRAST  TECHNIQUE: Contiguous axial images were obtained from the base of the skull through the vertex without intravenous contrast.  COMPARISON:  None.  FINDINGS: Skull and Sinuses:Negative for fracture or destructive process.  Fluid levels in the sphenoid sinuses, likely from intubation status.  Orbits: No acute abnormality.  Brain: Motion degraded imaging, limiting diagnostic sensitivity and specificity.  There is indistinct gray-white junction multiple levels, not entirely explained by motion. Deep gray nuclei are visible and symmetric. No hemorrhage, hydrocephalus, or shift.  IMPRESSION: 1. Limited motion degraded study. 2. Poor gray-white differentiation, possible cytotoxic edema from hypoxic ischemic injury. 3. No intracranial hemorrhage or herniation.   Electronically Signed   By: Marnee Spring M.D.   On: 04/14/2015 12:03   Dg Chest Port 1 View  04/16/2015   CLINICAL DATA:  Follow-up respiratory failure common recent sepsis,  shock, and cardiac arrest  EXAM: PORTABLE CHEST - 1 VIEW  COMPARISON:  Portable chest x-ray of April 15, 2015.  FINDINGS: The lungs remain hypoinflated. The interstitial markings are normal today. The cardiac silhouette remains enlarged. The pulmonary vascularity is less engorged and more distinct. There is no significant pleural effusion. There is no pneumothorax. The endotracheal tube tip lies 3.3 cm above the carina. The esophagogastric tube tip projects below the level of the GE junction. The right internal jugular venous catheter tip projects at the junction of the middle and distal thirds of the SVC. The bony thorax is unremarkable. A stable tubular structure projects over the right upper quadrant of the abdomen.  IMPRESSION: Interval improvement in the appearance of the pulmonary interstitium with decreased pulmonary vascular congestion. The support tubes are stable.   Electronically Signed   By: David  Swaziland M.D.   On: 04/16/2015 07:05   Dg Chest Port 1 View  04/15/2015   CLINICAL DATA:  Central line placement  EXAM: PORTABLE CHEST - 1 VIEW  COMPARISON:  04/14/2015  FINDINGS: Endotracheal tube is appropriately positioned. Nasogastric tube tip terminates below the level of the diaphragms but is not included in the field of view. Right IJ central line terminates over the distal SVC. No pneumothorax. No other change.  IMPRESSION: Right IJ central line placement with tip over distal SVC.   Electronically Signed   By: Christiana Pellant M.D.   On: 04/15/2015 21:42   Dg Chest Port 1 View  04/15/2015   Ricarda Frame, MD     04/15/2015  8:14 PM Central Venous Catheter Insertion Procedure Note Paige [name] [id number] Dec 28, 1950  Procedure: Insertion of Central Venous Catheter Indications: Drug and/or fluid administration  Procedure Details Consent: Unable to obtain consent because of altered level of  consciousness. and medical necesity Procedure performed as part  of operation performed by Dr. Smitty Cords. Time Out:  Verified patient identification, verified procedure,  site/side was marked, verified correct patient position, special  equipment/implants available, medications/allergies/relevent  history reviewed, required imaging and test results available.   Performed  Maximum sterile technique was used including antiseptics, cap,  gloves, gown, hand hygiene, mask and sheet. Skin prep: Chlorhexidine; local anesthetic administered A antimicrobial bonded/coated triple lumen catheter was placed in  the right internal jugular vein using the Seldinger technique.  Evaluation Blood flow good Complications: No apparent complications Patient did tolerate procedure well. Chest X-ray ordered to verify placement.  CXR: pending.  Ricarda Frame 04/15/2015, 8:11 PM      Medications:   . sodium chloride 150 mL/hr at 04/16/15 0430  . norepinephrine 4 mcg/min (04/16/15 0430)   . antiseptic oral rinse  7 mL Mouth Rinse QID  . chlorhexidine gluconate  15 mL Mouth Rinse BID  . insulin aspart  0-15 Units Subcutaneous 6 times per day  . lacosamide (VIMPAT) IV  50 mg Intravenous Q12H  . meropenem (MERREM) IV  500 mg Intravenous Q12H  . micafungin (MYCAMINE) IV  50 mg Intravenous Q24H  . mupirocin ointment  1 application Nasal BID  . pantoprazole (PROTONIX) IV  40 mg Intravenous Q12H  . sodium chloride  3 mL Intravenous Q12H  . thiamine IV  100 mg Intravenous Daily   fentaNYL (SUBLIMAZE) injection, ondansetron (ZOFRAN) IV, sodium chloride  Assessment/ Plan:  64 y.o. female with a PMHX of morbid obesity, DVT, PE, IVC filter, GERD, recent gastric bypass procedure on 8/22 at [facility], was admitted on 04/13/2015 with cardiac arrest, acute renal failure, severe coagulopathy.  1. Acute renal failure due to ATN . Baseline creatinine 0.6 on August 22 -Cr continues to decline daily, continue supportive care with pressors/IVFs.  No indication for dialysis at present.  2. Acute respiratory failure - pt remains on vent support  post operatively, pulm/cc following.  3. Septic shock: klebseilla growing in blood, pt now on meropenem.  4. Recent LAPAROSCOPIC BILIOPANCREATIC DIVERSION, DUODENAL SWITCH AND HIATAL HERNIA REPAIR s/p exp lap 04/15/15  5.  Hypokalemia:  Resolved, in fact K slightly high this AM, will continue to monitor.   LOS: 3 Ludwin Flahive 8/31/201610:02 AM

## 2015-04-16 NOTE — Progress Notes (Signed)
[facility] [place] [facility]   PATIENT NAME: Paige [name]    MR#:  [id number]  DATE OF BIRTH:  1951-06-14  SUBJECTIVE:  CHIEF COMPLAINT:  No chief complaint on file.  Continues on full ventilator support, pressors, non responsive without sedation.   REVIEW OF SYSTEMS:   ROS unable to obtain due to AMS  DRUG ALLERGIES:   Allergies  Allergen Reactions  . Lisinopril Swelling    This medication makes patient lips swell.    VITALS:  Blood pressure 113/71, pulse 108, temperature 100 F (37.8 C), temperature source Core (Comment), resp. rate 23, height 5\' 3"  (1.6 m), weight 178.3 kg (393 lb 1.3 oz), SpO2 96 %.  PHYSICAL EXAMINATION:  GENERAL:  64 y.o.-year-old patient lying in the bed critically ill, on ventilator, nonresponsive EYES: Pupils equal, round, sluggish reaction. No scleral icterus.  LUNGS: mechanical ventilation, fair air movement, no wheezes or rhonchi.  CARDIOVASCULAR: S1, S2 normal. No murmurs, rubs, or gallops.  ABDOMEN: Soft, nondistended. Bowel sounds decrease, laparoscopic surgery wounds are clean and dry, dark thin material in JP drain. Dark bloody material per rectum EXTREMITIES: No pedal edema, cyanosis, or clubbing. Pulses +1 NEUROLOGIC: non responsive to physical or verbal stimuli PSYCHIATRIC: non responsive SKIN: surgical wounds are clean and dry, thick dark material via JP drain  LABORATORY PANEL:   CBC  Recent Labs Lab 04/16/15 0547  WBC 22.2*  HGB 9.5*  HCT 30.7*  PLT 69*   ------------------------------------------------------------------------------------------------------------------  Chemistries   Recent Labs Lab 04/13/15 0625  04/16/15 0547  NA 145  < > 145  K 4.7  < > 5.3*  CL 108  < > 116*  CO2 21*  < > 25  GLUCOSE 189*  < > 103*  BUN 78*  < > 59*  CREATININE 4.62*  < > 1.55*  CALCIUM 7.9*  < > 7.3*  MG 2.7*  --   --   AST  --   < > 66*  ALT  --   < > 52  ALKPHOS  --   < > 71  BILITOT   --   < > 3.9*  < > = values in this interval not displayed. ------------------------------------------------------------------------------------------------------------------  Cardiac Enzymes  Recent Labs Lab 04/14/15 0504  TROPONINI 0.03   ------------------------------------------------------------------------------------------------------------------  RADIOLOGY:  Ct Head Wo Contrast  04/15/2015   CLINICAL DATA:  Cardiac arrest.  Altered mental status.  EXAM: CT HEAD WITHOUT CONTRAST  TECHNIQUE: Contiguous axial images were obtained from the base of the skull through the vertex without intravenous contrast.  COMPARISON:  04/14/2015  FINDINGS: As prior study, the study quality is is limited and degraded by motion. It is difficult to completely exclude some edema within the cerebral hemispheres bilaterally due to motion degradation. Note definite convincing changes of infarct. No hemorrhage or hydrocephalus.  IMPRESSION: Motion degraded study. Note definite infarct, although the gray-white differentiation is difficult to evaluate in the cerebral hemispheres due to motion degradation.   Electronically Signed   By: Charlett Nose M.D.   On: 04/15/2015 14:44   Dg Chest Port 1 View  04/16/2015   CLINICAL DATA:  Follow-up respiratory failure common recent sepsis, shock, and cardiac arrest  EXAM: PORTABLE CHEST - 1 VIEW  COMPARISON:  Portable chest x-ray of April 15, 2015.  FINDINGS: The lungs remain hypoinflated. The interstitial markings are normal today. The cardiac silhouette remains enlarged. The pulmonary vascularity is less engorged and more distinct. There is no significant pleural effusion. There  is no pneumothorax. The endotracheal tube tip lies 3.3 cm above the carina. The esophagogastric tube tip projects below the level of the GE junction. The right internal jugular venous catheter tip projects at the junction of the middle and distal thirds of the SVC. The bony thorax is unremarkable. A  stable tubular structure projects over the right upper quadrant of the abdomen.  IMPRESSION: Interval improvement in the appearance of the pulmonary interstitium with decreased pulmonary vascular congestion. The support tubes are stable.   Electronically Signed   By: David  [place]aziland M.D.   On: 04/16/2015 07:05   Dg Chest Port 1 View  04/15/2015   CLINICAL DATA:  Central line placement  EXAM: PORTABLE CHEST - 1 VIEW  COMPARISON:  04/14/2015  FINDINGS: Endotracheal tube is appropriately positioned. Nasogastric tube tip terminates below the level of the diaphragms but is not included in the field of view. Right IJ central line terminates over the distal SVC. No pneumothorax. No other change.  IMPRESSION: Right IJ central line placement with tip over distal SVC.   Electronically Signed   By: Christiana Pellant M.D.   On: 04/15/2015 21:42   Dg Chest Port 1 View  04/15/2015   Ricarda Frame, MD     04/15/2015  8:14 PM Central Venous Catheter Insertion Procedure Note Paige [name] [id number] 1951-05-02  Procedure: Insertion of Central Venous Catheter Indications: Drug and/or fluid administration  Procedure Details Consent: Unable to obtain consent because of altered level of  consciousness. and medical necesity Procedure performed as part  of operation performed by Dr. Smitty Cords. Time Out: Verified patient identification, verified procedure,  site/side was marked, verified correct patient position, special  equipment/implants available, medications/allergies/relevent  history reviewed, required imaging and test results available.   Performed  Maximum sterile technique was used including antiseptics, cap,  gloves, gown, hand hygiene, mask and sheet. Skin prep: Chlorhexidine; local anesthetic administered A antimicrobial bonded/coated triple lumen catheter was placed in  the right internal jugular vein using the Seldinger technique.  Evaluation Blood flow good Complications: No apparent complications Patient did tolerate  procedure well. Chest X-ray ordered to verify placement.  CXR: pending.  Ricarda Frame 04/15/2015, 8:11 PM     EKG:   Orders placed or performed during the hospital encounter of 04/13/15  . EKG 12-Lead  . EKG 12-Lead    ASSESSMENT AND PLAN:   1) shock: cardiogenic, hemorhagic and septic - appreciate critical care following - Pressors restarted this morning   2) cardiac arrest - appreciate cardiology consultation - likely due to sepsis - New systolic heart failure  3) respiratory failure - full ventilator support - CXR with cardiomegaly and edema  4) encephalopathy - neurology following, CT suggestive of anoxic brain injury, EEG suggestive of metabolic toxic encephalopathy - not requiring sedation for ventilation - After greater than 48 hours with no improvement in neurologic status it is becoming less likely that she will regain function. Dr. Katrinka Blazing to re-assess tomorrow.  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, had been on high dose lovenox after surgery, only one dose of coumadin prior to admission - fibrinogen elevated, plt's slightly low, not DIC, though likely related to sepsis  6) gi bleeding/history of PUD - Dr Shelle Iron feels NG bloody output more likely due to NG tube trauma - continue protonix infusion - Now with dark red blood per rectum, I suspect this is due to ischemic/necrotic bowel  7) sepsis and klebsiella bacteremia -  continue meropenem - WBC increasing possibly due to surgery - likely GI source  8) acute renal failure - Cr improving slowly, UOP improving   9) acute systolic congestive heart failure - ECHO with ef 35%, no prior history of CHF - if survives will need further work up  10)  gastric bypass postop day 9, exploratory laparotomy yesterday showing dehiscence at the anastomosis with leaking of bowel contents - appreciate surgery following  All the records are reviewed and case discussed with the  patient's daughter Synetta Fail and son in Social worker. Dscussed with Dr. Sung Amabile, Dr Philis Nettle  CODE STATUS: Limited  TOTAL CRITICAL CARE TIME TAKING CARE OF THIS PATIENT: 35 minutes.  Greater than 50% of time spent in care coordination and counseling. Overall prognosis poor.  Elby Showers M.D on 04/16/2015 at 5:11 PM  Between 7am to 6pm - Pager - [phone]  After 6pm go to [email or web address] - password [email or web address] [facility]  [facility]  Office  [phone]  CC: Primary care physician; [name], MD

## 2015-04-16 NOTE — Progress Notes (Signed)
NEUROLOGY NOTE  S: pt had surgery yesterday which showed leaking anastomosis;  Now on pressors again  ROS unobtainable secondary to mental status  O: 98.7 104*72 85 18 Obese, moderate distress Normocephalic, oropharynx clear Supple, nl JVD CTA B but there is some wheezing from trachia RRR, no murmur No C/C/E  Intubated, not sedated, opens to pain but does not track or follow, GCS 5T PERRLA, + corneals B, slight L gaze, good cough Weak flexion B to pain  Ct still shows artifact  A/P: 1. Encephalopathy- Still unchanged but septic abdomin could cause severe encephalopathy.  On eeg this appears to be more toxic/metabolic and infectious related; The typical pattern of burst suppression with anoxic injury is not seen. Pt has had trace improvement in exam and is now off of all pressors. - Hopefully tube feeds tomorrow - Will start neurostimulation tomorrow -  Repeat EEG on Friday or Monday - Would give until next week to see if there is improvement  - Will follow

## 2015-04-16 NOTE — Progress Notes (Signed)
eLink Physician-Brief Progress Note Patient Name: Paige [name] DOB: 07/23/1951 MRN: [id number]   Date of Service  04/16/2015  HPI/Events of Note  Called about progressive hypotension.  MAPs drifting into low 60s. CVP 16 with LVEF of 35%. S/P Ex Lap with drainage of abscesses. GNR bacteremia  eICU Interventions  Restart levaphed. Continue IVF NS at 150cc/hr.   Intervention Category Major Interventions: Hypotension - evaluation and management  Willis Kuipers 04/16/2015, 1:18 AM

## 2015-04-16 NOTE — Anesthesia Postprocedure Evaluation (Signed)
  Anesthesia Post-op Note  Patient: Paige [name]  Procedure(s) Performed: Procedure(s) with comments: LAPAROSCOPY DIAGNOSTIC (N/A) EXPLORATORY LAPAROTOMY ,SUTURE OF RUPTURED INTESTINAL PERFORATION, DRAINAGE OF SUBPHRENIC ABSCESS, INSERTION JEJUNOSTOMYTUBE,  (N/A)  central line insertion Dr. Tonita Cong (Right) - internakl jugular  Anesthesia type:General ETT  Patient location: IC 20  Post pain: UTA  Post assessment: pt in IC 20 vented on pressors, unresponsive to stim  Post vital signs: on pressors  Last Vitals:  Filed Vitals:   04/16/15 0630  BP: 92/70  Pulse: 109  Temp: 37.3 C  Resp: 21    Level of consciousness: unresponsive  Complications: No apparent anesthesia complications

## 2015-04-16 NOTE — Progress Notes (Signed)
PULMONARY / CRITICAL CARE MEDICINE   Name: Paige [name] MRN: [id number] DOB: 03/15/1951    ADMISSION DATE:  04/28/2015  PT PROFILE:  75 F underwent bariatric surgery 8/22 at [facility]. Noted to have had normal renal function 8/24. Was discharged to home 8/26. EMS called to home 04-28-23 for dyspnea. Coded in ambulance. Approx 10 mins ACLS. Admitted to ICU with profound coagulopathy (was discharged to home on enoxaparin 120 mg BID), shock, atrial fibrillation, VRDF, AKI, post anoxic encephalopathy. She has a PMH of DVT, s/p IVC filter, recent gastric ulcer  MAJOR EVENTS/TEST RESULTS: 2023-04-28 Echocardiogram: EF 35% 8/29 CT head: Limited motion degraded study. Poor gray-white differentiation, possible cytotoxic edema from hypoxic ischemic injury. No intracranial hemorrhage or herniation 8/29 Echocardiogram: The LV cavity size was severely dilated. Systolic function was moderately reduced. The estimated ejection fraction was 35%. Akinesis of the anterior myocardium. Hypokinesis of the inferolateral myocardium. Akinesis of the apical myocardium  8/30 EEG: EEG with moderate slowing 8/30 repeat CT head: Motion degraded study. Note definite infarct, although the gray-white differentiation is difficult to evaluate in the cerebral hemispheres due to motion degradation 8/30 remains comatose. Off vasopressors. Renal function improving 8/30 Exploratory laparoscopy converted to laparotomy: repair of anastomotic leak, washout of abscesses. Back on vasopressors post op  INDWELLING DEVICES:: ETT 2023-04-28 >>  R IJ CVL April 28, 2023 >> 8/30 R femoral A-line Apr 28, 2023 >> 8/30 R IJ CVL 8/30 >>  L radiall A-line 8/30 >>   MICRO DATA: Blood 04/28/2023 >> Klebsiella    ANTIMICROBIALS:  Pip-tazo 04-28-23 >> 8/30 Meropenem 8/30 >>  Micafungin 8/30 >>   SUBJ/INTERVAL: Remains minimally responsive off all sedation   VITAL SIGNS: Temp:  [98.1 F (36.7 C)-100.2 F (37.9 C)] 100.2 F (37.9 C) (08/31 1900) Pulse Rate:  [31-121]  31 (08/31 1900) Resp:  [16-29] 29 (08/31 1900) BP: (74-122)/(50-75) 122/70 mmHg (08/31 1900) SpO2:  [95 %-100 %] 99 % (08/31 1951) Arterial Line BP: (69-128)/(41-61) 118/51 mmHg (08/31 1900) FiO2 (%):  [30 %-40 %] 30 % (08/31 1951) HEMODYNAMICS: CVP:  [10 mmHg-16 mmHg] 12 mmHg VENTILATOR SETTINGS: Vent Mode:  [-] PRVC FiO2 (%):  [30 %-40 %] 30 % Set Rate:  [16 bmp] 16 bmp Vt Set:  [450 mL] 450 mL PEEP:  [5 cmH20] 5 cmH20 INTAKE / OUTPUT:  Intake/Output Summary (Last 24 hours) at 04/16/15 2029 Last data filed at 04/16/15 1900  Gross per 24 hour  Intake 3986.38 ml  Output   2245 ml  Net 1741.38 ml    PHYSICAL EXAMINATION: General: Morbidly obese, comatose, intubated Neuro: PERRL, corneals absent, no spontaneous movement, no withdrawal from pain HEENT: NCAT Cardiovascular: IRIR, mildly tachy, no M noted Lungs: coarse BS, no wheezes Abdomen: Markedly obese, maroon aspirate from NGT (now removed), maroon aspirate in JP drain, sequelae of recent surgery noted, induration in SQ tissues on lower abd Ext: 1+ symmetric LE edema  LABS:  CBC  Recent Labs Lab 04/14/15 2321 04/15/15 0446 04/16/15 0547  WBC 17.5* 18.6* 22.2*  HGB 9.5* 9.4* 9.5*  HCT 29.1* 28.6* 30.7*  PLT 95* 95* 69*   Coag's  Recent Labs Lab 2015-04-28 1718 04-28-2015 2344 04/14/15 0504 04/15/15 0446 04/16/15 0800  APTT 45* 42* 41*  --   --   INR 1.93 1.76 1.69 1.43 1.69   BMET  Recent Labs Lab 04/14/15 0504 04/15/15 0446 04/16/15 0547  NA 142 143 145  K 3.8 3.1* 5.3*  CL 110 113* 116*  CO2 21* 23 25  BUN  78* 65* 59*  CREATININE 3.18* 1.94* 1.55*  GLUCOSE 138* 146* 103*   Electrolytes  Recent Labs Lab 04/13/15 0625  04/14/15 0504 04/15/15 0446 04/16/15 0547  CALCIUM 7.9*  < > 7.7* 7.8* 7.3*  MG 2.7*  --   --   --   --   < > = values in this interval not displayed. Sepsis Markers  Recent Labs Lab 04/13/15 0642 04/13/15 1130 04/14/15 0504  LATICACIDVEN 6.3* 1.7 1.8  PROCALCITON   --   --  66.58   ABG  Recent Labs Lab 04/13/15 0639 04/13/15 1400 04/14/15 0517  PHART 7.29* 7.35 7.44  PCO2ART 37 34 30*  PO2ART 174* 209* 92   Liver Enzymes  Recent Labs Lab 04/14/15 0504 04/15/15 0446 04/16/15 0547  AST 85* 48* 66*  ALT 62* 49 52  ALKPHOS 53 62 71  BILITOT 3.2* 2.0* 3.9*  ALBUMIN 2.0* 1.8* 1.5*   Cardiac Enzymes  Recent Labs Lab 04/13/15 1718 04/13/15 2157 04/14/15 0504  TROPONINI <0.03 <0.03 0.03   Glucose  Recent Labs Lab 04/16/15 0020 04/16/15 0448 04/16/15 0759 04/16/15 1131 04/16/15 1602 04/16/15 2000  GLUCAP 93 98 98 92 96 101*    CXR: No edema or infiltrates   ASSESSMENT / PLAN:  CARDIOVASCULAR A: Out of hospital cardiac arrest 8/28 Septic shock New onset AF P:  CVP goal 12-15 mmHg. PRN NS boluses -MAP goal > 65 mmHg. 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 No further bleeding evident Thrombocytopenia P:  DVT px: SCDs Monitor CBC intermittently Transfuse per usual guidelines  INFECTIOUS A:   Klebsiella bacteremia  Peritonitis with intra-abdominal abscesses P:   Monitor temp, WBC count Micro and abx as above  ENDOCRINE A:   Hyperglycemia P:   Cont SSI, moderate scale  NEUROLOGIC A:   Acute encephalopathy - TME and/or post anoxic P:   RASS goal: 0, -1 Minimize sedatives Neurology following. Recommends support through [facility]   FAMILY   Daughter and son in law updated in detail. Discussed with Dr Clent Ridges   CCM time 40 mins   Billy Fischer, MD PCCM service Mobile [phone] Pager [phone]

## 2015-04-16 NOTE — Progress Notes (Signed)
   SUBJECTIVE: Pt remains intubated and sedated.    Filed Vitals:   04/16/15 0700 04/16/15 0800 04/16/15 0900 04/16/15 1000  BP: 97/57 104/56 99/64 99/72   Pulse: 107 97 113 98  Temp: 99.3 F (37.4 C) 99.3 F (37.4 C) 99.5 F (37.5 C) 99.5 F (37.5 C)  TempSrc:      Resp: Height:      Weight:      SpO2: 100% 100% 99% 98%    Intake/Output Summary (Last 24 hours) at 04/16/15 1052 Last data filed at 04/16/15 1040  Gross per 24 hour  Intake 4631.38 ml  Output   1900 ml  Net 2731.38 ml    LABS: Basic Metabolic Panel:  Recent Labs  16/10/96 0446 04/16/15 0547  NA 143 145  K 3.1* 5.3*  CL 113* 116*  CO2 23 25  GLUCOSE 146* 103*  BUN 65* 59*  CREATININE 1.94* 1.55*  CALCIUM 7.8* 7.3*   Liver Function Tests:  Recent Labs  04/15/15 0446 04/16/15 0547  AST 48* 66*  ALT 49 52  ALKPHOS 62 71  BILITOT 2.0* 3.9*  PROT 5.6* 5.2*  ALBUMIN 1.8* 1.5*   No results for input(s): LIPASE, AMYLASE in the last 72 hours. CBC:  Recent Labs  04/15/15 0446 04/16/15 0547  WBC 18.6* 22.2*  HGB 9.4* 9.5*  HCT 28.6* 30.7*  MCV 80.0 84.4  PLT 95* 69*   Cardiac Enzymes:  Recent Labs  04/13/15 1718 04/13/15 2157 04/14/15 0504  TROPONINI <0.03 <0.03 0.03   BNP: Invalid input(s): POCBNP D-Dimer: No results for input(s): DDIMER in the last 72 hours. Hemoglobin A1C: No results for input(s): HGBA1C in the last 72 hours. Fasting Lipid Panel: No results for input(s): CHOL, HDL, LDLCALC, TRIG, CHOLHDL, LDLDIRECT in the last 72 hours. Thyroid Function Tests:  Recent Labs  04/14/15 1513  TSH 5.380*   Anemia Panel:  Recent Labs  04/14/15 1513  VITAMINB12 3600*  FOLATE 5.3*     PHYSICAL EXAM General: Well developed, well nourished, in no acute distress HEENT:  Normocephalic and atramatic Neck:  No JVD.  Lungs: Clear bilaterally to auscultation and percussion. Heart: HRRR . Normal S1 and S2 without gallops or murmurs.  Abdomen: Bowel sounds are  positive, abdomen soft and non-tender  Msk:  Back normal, normal gait. Normal strength and tone for age. Extremities: No clubbing, cyanosis or edema.   Neuro: Alert and oriented X 3. Psych:  Good affect, responds appropriately  TELEMETRY: Reviewed telemetry pt in a-fib VR in 110s  ASSESSMENT AND PLAN:  pt s/p cardiac arrest, troponin negative x3. Likely 2/2 septic shock.  CHF: Echo shows decreased EF (35%). Consider Bb if prognosis improves.   Atrial fibrillation: Amiodarone gtt never started 2/2 hypotension, will d/c order. VR now in 110 range, will add digoxin 0.25mg  IV once.     Patient and plan discussed with supervising provider, Dr. Adrian Blackwater, who agrees with above findings.   Paige [name] Margarito Courser [facility]  04/16/2015 10:52 AM

## 2015-04-16 NOTE — Progress Notes (Signed)
Nutrition Follow-up     INTERVENTION:  Coordination of care: Discussed in ICU rounds this am and awaiting surgery input regarding starting nutrition   NUTRITION DIAGNOSIS:   Inadequate oral intake related to altered GI function as evidenced by NPO status.    GOAL:   Provide needs based on ASPEN/SCCM guidelines    MONITOR:    (Energy intake, Electrolyte and renal profile, digestive system)  REASON FOR ASSESSMENT:   Ventilator    ASSESSMENT:      Pt s/p exploratory laparotomy showing anastomotic dehiscence with intra-abdominal abscesses, feeding jejunostomy tube placment and repair of serous tears of the small bowel.    Current Nutrition: NPO   Gastrointestinal Profile: hypoactive bowel sounds noted 175 ml output noted from J tube Noted output from multiple drains noted  UOP: last 24 hr   Medications: aspart, thiamine, NS at 130ml/hr, levophed  Electrolyte/Renal Profile and Glucose Profile:   Recent Labs Lab 04/13/15 0625  04/14/15 0504 04/15/15 0446 04/16/15 0547  NA 145  < > 142 143 145  K 4.7  < > 3.8 3.1* 5.3*  CL 108  < > 110 113* 116*  CO2 21*  < > 21* 23 25  BUN 78*  < > 78* 65* 59*  CREATININE 4.62*  < > 3.18* 1.94* 1.55*  CALCIUM 7.9*  < > 7.7* 7.8* 7.3*  MG 2.7*  --   --   --   --   GLUCOSE 189*  < > 138* 146* 103*  < > = values in this interval not displayed. Protein Profile:  Recent Labs Lab 04/14/15 0504 04/15/15 0446 04/16/15 0547  ALBUMIN 2.0* 1.8* 1.5*     Nutrition-Focused Physical Exam Findings:  Unable to complete Nutrition-Focused physical exam at this time.     Weight Trend since Admission: Filed Weights   04/13/15 1500 04/15/15 0500  Weight: 381 lb 6.3 oz (173 kg) 393 lb 1.3 oz (178.3 kg)      Diet Order:  Diet NPO time specified  Skin:    reviewed   Height:   Ht Readings from Last 1 Encounters:  04/13/15  (1.6 m)    Weight:   Wt Readings from Last 1 Encounters:  04/15/15 393 lb 1.3  oz (178.3 kg)     BMI:  Body mass index is 69.65 kg/(m^2).  Estimated Nutritional Needs:   Kcal:  (22-25 kcals/kg IBW) 1144-1300 kcals/d (Using IBW of 52kg)  Protein:  (2.0-2.5 g/IBW) 104-130 g/d  Fluid:  (25-65ml/kg) 1300-1534ml/d  EDUCATION NEEDS:   No education needs identified at this time  HIGH Care Level  Ahron Hulbert B. Freida Busman, RD, LDN [phone] (pager)

## 2015-04-17 LAB — BASIC METABOLIC PANEL
Anion gap: 7 (ref 5–15)
BUN: 36 mg/dL — AB (ref 6–20)
CALCIUM: 7.5 mg/dL — AB (ref 8.9–10.3)
CO2: 23 mmol/L (ref 22–32)
Chloride: 118 mmol/L — ABNORMAL HIGH (ref 101–111)
Creatinine, Ser: 1.03 mg/dL — ABNORMAL HIGH (ref 0.44–1.00)
GFR calc Af Amer: >60 mL/min (ref >60)
GFR, EST NON AFRICAN AMERICAN: 56 mL/min — AB (ref >60)
GLUCOSE: 114 mg/dL — AB (ref 65–99)
Potassium: 3.1 mmol/L — ABNORMAL LOW (ref 3.5–5.1)
Sodium: 148 mmol/L — ABNORMAL HIGH (ref 135–145)

## 2015-04-17 LAB — RENAL FUNCTION PANEL
ALBUMIN: 1.5 g/dL — AB (ref 3.5–5.0)
Anion gap: 7 (ref 5–15)
BUN: 36 mg/dL — AB (ref 6–20)
CO2: 23 mmol/L (ref 22–32)
CREATININE: 0.98 mg/dL (ref 0.44–1.00)
Calcium: 7.6 mg/dL — ABNORMAL LOW (ref 8.9–10.3)
Chloride: 119 mmol/L — ABNORMAL HIGH (ref 101–111)
GFR calc Af Amer: >60 mL/min (ref >60)
GFR, EST NON AFRICAN AMERICAN: 60 mL/min — AB (ref >60)
GLUCOSE: 114 mg/dL — AB (ref 65–99)
PHOSPHORUS: 1.7 mg/dL — AB (ref 2.5–4.6)
POTASSIUM: 3.1 mmol/L — AB (ref 3.5–5.1)
Sodium: 149 mmol/L — ABNORMAL HIGH (ref 135–145)

## 2015-04-17 LAB — CULTURE, BLOOD (ROUTINE X 2)

## 2015-04-17 LAB — CBC
HCT: 27.3 % — ABNORMAL LOW (ref 35.0–47.0)
Hemoglobin: 8.6 g/dL — ABNORMAL LOW (ref 12.0–16.0)
MCH: 25.8 pg — AB (ref 26.0–34.0)
MCHC: 31.5 g/dL — ABNORMAL LOW (ref 32.0–36.0)
MCV: 81.9 fL (ref 80.0–100.0)
PLATELETS: 64 10*3/uL — AB (ref 150–440)
RBC: 3.33 MIL/uL — ABNORMAL LOW (ref 3.80–5.20)
RDW: 18.6 % — AB (ref 11.5–14.5)
WBC: 11.8 10*3/uL — ABNORMAL HIGH (ref 3.6–11.0)

## 2015-04-17 LAB — GLUCOSE, CAPILLARY
GLUCOSE-CAPILLARY: 101 mg/dL — AB (ref 65–99)
GLUCOSE-CAPILLARY: 104 mg/dL — AB (ref 65–99)
Glucose-Capillary: 105 mg/dL — ABNORMAL HIGH (ref 65–99)
Glucose-Capillary: 97 mg/dL (ref 65–99)
Glucose-Capillary: 102 mg/dL — ABNORMAL HIGH (ref 65–99)

## 2015-04-17 MED ORDER — VITAL HIGH PROTEIN PO LIQD
1000.0000 mL | ORAL | Status: DC
  Administered 2015-04-17: 1000 mL

## 2015-04-17 MED ORDER — POTASSIUM CHLORIDE 20 MEQ/15ML (10%) PO SOLN
40.0000 meq | Freq: Once | ORAL | Status: AC
  Administered 2015-04-17: 40 meq
  Filled 2015-04-17: qty 30

## 2015-04-17 MED ORDER — POTASSIUM CHLORIDE 10 MEQ/50ML IV SOLN
10.0000 meq | INTRAVENOUS | Status: AC
  Administered 2015-04-17 (×4): 10 meq via INTRAVENOUS
  Filled 2015-04-17 (×4): qty 50

## 2015-04-17 MED ORDER — FREE WATER
60.0000 mL | Status: DC
  Administered 2015-04-17 – 2015-04-18 (×4): 60 mL

## 2015-04-17 MED ORDER — SODIUM CHLORIDE 0.9 % IV SOLN
1.0000 g | Freq: Three times a day (TID) | INTRAVENOUS | Status: DC
  Administered 2015-04-17 – 2015-04-18 (×4): 1 g via INTRAVENOUS
  Filled 2015-04-17 (×7): qty 1

## 2015-04-17 MED ORDER — AMANTADINE HCL 50 MG/5ML PO SYRP
100.0000 mg | ORAL_SOLUTION | Freq: Two times a day (BID) | ORAL | Status: DC
  Administered 2015-04-17 (×2): 100 mg via ORAL
  Filled 2015-04-17 (×3): qty 10

## 2015-04-17 NOTE — Progress Notes (Signed)
[facility]  ROUNDING NOTE   Subjective:  Renal function has been improving. No new Cr this AM.  INR currently 1.69. Remains on the vent.   Objective:  Vital signs in last 24 hours:  Temp:  [99.3 F (37.4 C)-100.6 F (38.1 C)] 99.7 F (37.6 C) (09/01 0600) Pulse Rate:  [31-121] 103 (09/01 0600) Resp:  [19-29] 19 (09/01 0600) BP: (99-122)/(50-85) 119/72 mmHg (09/01 0600) SpO2:  [95 %-100 %] 100 % (09/01 0600) Arterial Line BP: (95-128)/(45-61) 126/57 mmHg (09/01 0600) FiO2 (%):  [30 %] 30 % (09/01 0430) Weight:  [171.7 kg (378 lb 8.5 oz)] 171.7 kg (378 lb 8.5 oz) (09/01 0500)  Weight change:  Filed Weights   04/13/15 1500 04/15/15 0500 04/17/15 0500  Weight: 173 kg (381 lb 6.3 oz) 178.3 kg (393 lb 1.3 oz) 171.7 kg (378 lb 8.5 oz)    Intake/Output: I/O last 3 completed shifts: In: 5255.5 [I.V.:4555.5; Other:300; IV Piggyback:400] Out: 4505 [Urine:3050; Drains:1455]   Intake/Output this shift:     Physical Exam: General: Critically ill appearing   Head: Normocephalic, ETT in place  Eyes: Eyes closed  Neck: Supple, trachea midline  Lungs:  Clear to auscultation vent assisted  Heart: S1S2 no rubs  Abdomen:  Soft, distended, abdominal tube in place  Extremities:  1+ peripheral edema  Neurologic: Obtunded at present  Skin: No lesions       Basic Metabolic Panel:  Recent Labs Lab 04/13/15 0625 04/13/15 1130 04/13/15 2157 04/14/15 0504 04/15/15 0446 04/16/15 0547  NA 145 138 141 142 143 145  K 4.7 4.4 3.7 3.8 3.1* 5.3*  CL 108 108 110 110 113* 116*  CO2 21* 20* 21* 21* 23 25  GLUCOSE 189* 164* 141* 138* 146* 103*  BUN 78* 72* 75* 78* 65* 59*  CREATININE 4.62* 3.68* 3.48* 3.18* 1.94* 1.55*  CALCIUM 7.9* 6.9* 7.5* 7.7* 7.8* 7.3*  MG 2.7*  --   --   --   --   --     Liver Function Tests:  Recent Labs Lab 04/13/15 1130 04/14/15 0504 04/15/15 0446 04/16/15 0547  AST 179* 85* 48* 66*  ALT 90* 62* 49 52  ALKPHOS 64 53 62 71  BILITOT  1.8* 3.2* 2.0* 3.9*  PROT 5.6* 5.9* 5.6* 5.2*  ALBUMIN 1.8* 2.0* 1.8* 1.5*   No results for input(s): LIPASE, AMYLASE in the last 168 hours. No results for input(s): AMMONIA in the last 168 hours.  CBC:  Recent Labs Lab 04/14/15 1257 04/14/15 1513 04/14/15 2321 04/15/15 0446 04/16/15 0547  WBC 18.4* 18.2* 17.5* 18.6* 22.2*  HGB 10.3* 10.3* 9.5* 9.4* 9.5*  HCT 31.5* 31.5* 29.1* 28.6* 30.7*  MCV 79.5* 79.5* 79.4* 80.0 84.4  PLT 126* 121* 95* 95* 69*    Cardiac Enzymes:  Recent Labs Lab 04/13/15 0625 04/13/15 1130 04/13/15 1718 04/13/15 2157 04/14/15 0504  TROPONINI 0.04* 0.03 <0.03 <0.03 0.03    BNP: Invalid input(s): POCBNP  CBG:  Recent Labs Lab 04/16/15 1602 04/16/15 2000 04/17/15 0009 04/17/15 0449 04/17/15 0739  GLUCAP 96 101* 101* 104* 105*    Microbiology: Results for orders placed or performed during the hospital encounter of 04/13/15  MRSA PCR Screening     Status: Abnormal   Collection Time: 04/13/15 11:30 AM  Result Value Ref Range Status   MRSA by PCR POSITIVE (A) NEGATIVE Final    Comment: [facility] CALLED CHRISTINA MILES AT 1740 04/13/15        The GeneXpert MRSA Assay (FDA approved  for NASAL specimens only), is one component of a comprehensive MRSA colonization surveillance program. It is not intended to diagnose MRSA infection nor to guide or monitor treatment for MRSA infections.   Culture, blood (routine x 2)     Status: None   Collection Time: 04/13/15 12:45 PM  Result Value Ref Range Status   Specimen Description BLOOD LEFT HAND  Final   Special Requests   Final    BOTTLES DRAWN AEROBIC AND ANAEROBIC  AER 2CC ANA 1CC   Culture  Setup Time   Final    GRAM NEGATIVE RODS AEROBIC BOTTLE ONLY CRITICAL RESULT CALLED TO, READ BACK BY AND VERIFIED WITH: Cicero Duck TAYLOR 04/14/2015 0515 [facility] CONFIRMED BY [facility]    Culture   Final    KLEBSIELLA PNEUMONIAE IN BOTH AEROBIC AND ANAEROBIC BOTTLES    Report Status 04/17/2015 FINAL  Final    Organism ID, Bacteria KLEBSIELLA PNEUMONIAE  Final      Susceptibility   Klebsiella pneumoniae - MIC*    AMPICILLIN 16 RESISTANT Resistant     CEFTAZIDIME <=1 SENSITIVE Sensitive     CEFAZOLIN <=4 SENSITIVE Sensitive     CEFTRIAXONE <=1 SENSITIVE Sensitive     CIPROFLOXACIN <=0.25 SENSITIVE Sensitive     GENTAMICIN <=1 SENSITIVE Sensitive     IMIPENEM <=0.25 SENSITIVE Sensitive     TRIMETH/SULFA <=20 SENSITIVE Sensitive     PIP/TAZO Value in next row Sensitive      SENSITIVE<=4    * KLEBSIELLA PNEUMONIAE  Culture, blood (routine x 2)     Status: None   Collection Time: 04/13/15  1:05 PM  Result Value Ref Range Status   Specimen Description BLOOD RIGHT HAND  Final   Special Requests BOTTLES DRAWN AEROBIC AND ANAEROBIC  4CC  Final   Culture  Setup Time   Final    GRAM NEGATIVE RODS IN BOTH AEROBIC AND ANAEROBIC BOTTLES CRITICAL RESULT CALLED TO, READ BACK BY AND VERIFIED WITH: Cicero Duck TAYLOR 04/14/2015 0445 [facility]    Culture   Final    KLEBSIELLA PNEUMONIAE IN BOTH AEROBIC AND ANAEROBIC BOTTLES CONFIRMED BY [facility]    Report Status 04/16/2015 FINAL  Final   Organism ID, Bacteria KLEBSIELLA PNEUMONIAE  Final      Susceptibility   Klebsiella pneumoniae - MIC*    AMPICILLIN 16 RESISTANT Resistant     CEFTAZIDIME <=1 SENSITIVE Sensitive     CEFAZOLIN <=4 SENSITIVE Sensitive     CEFTRIAXONE <=1 SENSITIVE Sensitive     CIPROFLOXACIN <=0.25 SENSITIVE Sensitive     GENTAMICIN <=1 SENSITIVE Sensitive     IMIPENEM <=0.25 SENSITIVE Sensitive     TRIMETH/SULFA <=20 SENSITIVE Sensitive     PIP/TAZO Value in next row Sensitive      SENSITIVE<=4    * KLEBSIELLA PNEUMONIAE    Coagulation Studies:  Recent Labs  04/15/15 0446 04/16/15 0800  LABPROT 17.6* 20.1*  INR 1.43 1.69    Urinalysis: No results for input(s): COLORURINE, LABSPEC, PHURINE, GLUCOSEU, HGBUR, BILIRUBINUR, KETONESUR, PROTEINUR, UROBILINOGEN, NITRITE, LEUKOCYTESUR in the last 72 hours.  Invalid input(s): APPERANCEUR     Imaging: Ct Head Wo Contrast  04/15/2015   CLINICAL DATA:  Cardiac arrest.  Altered mental status.  EXAM: CT HEAD WITHOUT CONTRAST  TECHNIQUE: Contiguous axial images were obtained from the base of the skull through the vertex without intravenous contrast.  COMPARISON:  04/14/2015  FINDINGS: As prior study, the study quality is is limited and degraded by motion. It is difficult to completely exclude some edema within  the cerebral hemispheres bilaterally due to motion degradation. Note definite convincing changes of infarct. No hemorrhage or hydrocephalus.  IMPRESSION: Motion degraded study. Note definite infarct, although the gray-white differentiation is difficult to evaluate in the cerebral hemispheres due to motion degradation.   Electronically Signed   By: Charlett Nose M.D.   On: 04/15/2015 14:44   Dg Chest Port 1 View  04/16/2015   CLINICAL DATA:  Follow-up respiratory failure common recent sepsis, shock, and cardiac arrest  EXAM: PORTABLE CHEST - 1 VIEW  COMPARISON:  Portable chest x-ray of April 15, 2015.  FINDINGS: The lungs remain hypoinflated. The interstitial markings are normal today. The cardiac silhouette remains enlarged. The pulmonary vascularity is less engorged and more distinct. There is no significant pleural effusion. There is no pneumothorax. The endotracheal tube tip lies 3.3 cm above the carina. The esophagogastric tube tip projects below the level of the GE junction. The right internal jugular venous catheter tip projects at the junction of the middle and distal thirds of the SVC. The bony thorax is unremarkable. A stable tubular structure projects over the right upper quadrant of the abdomen.  IMPRESSION: Interval improvement in the appearance of the pulmonary interstitium with decreased pulmonary vascular congestion. The support tubes are stable.   Electronically Signed   By: David  [place] M.D.   On: 04/16/2015 07:05   Dg Chest Port 1 View  04/15/2015   CLINICAL DATA:   Central line placement  EXAM: PORTABLE CHEST - 1 VIEW  COMPARISON:  04/14/2015  FINDINGS: Endotracheal tube is appropriately positioned. Nasogastric tube tip terminates below the level of the diaphragms but is not included in the field of view. Right IJ central line terminates over the distal SVC. No pneumothorax. No other change.  IMPRESSION: Right IJ central line placement with tip over distal SVC.   Electronically Signed   By: Christiana Pellant M.D.   On: 04/15/2015 21:42   Dg Chest Port 1 View  04/15/2015   Ricarda Frame, MD     04/15/2015  8:14 PM Central Venous Catheter Insertion Procedure Note MILA PAIR [id number] [birth year]  Procedure: Insertion of Central Venous Catheter Indications: Drug and/or fluid administration  Procedure Details Consent: Unable to obtain consent because of altered level of  consciousness. and medical necesity Procedure performed as part  of operation performed by Dr. Smitty Cords. Time Out: Verified patient identification, verified procedure,  site/side was marked, verified correct patient position, special  equipment/implants available, medications/allergies/relevent  history reviewed, required imaging and test results available.   Performed  Maximum sterile technique was used including antiseptics, cap,  gloves, gown, hand hygiene, mask and sheet. Skin prep: Chlorhexidine; local anesthetic administered A antimicrobial bonded/coated triple lumen catheter was placed in  the right internal jugular vein using the Seldinger technique.  Evaluation Blood flow good Complications: No apparent complications Patient did tolerate procedure well. Chest X-ray ordered to verify placement.  CXR: pending.  Ricarda Frame 04/15/2015, 8:11 PM      Medications:   . dextrose 50 mL/hr at 04/16/15 2100  . norepinephrine Stopped (04/17/15 0600)   . antiseptic oral rinse  7 mL Mouth Rinse QID  . chlorhexidine gluconate  15 mL Mouth Rinse BID  . insulin aspart  0-15 Units Subcutaneous 6 times per  day  . lacosamide (VIMPAT) IV  50 mg Intravenous Q12H  . meropenem (MERREM) IV  500 mg Intravenous Q12H  . mupirocin ointment  1 application Nasal BID  . pantoprazole (PROTONIX) IV  40 mg Intravenous  Q12H  . sodium chloride  3 mL Intravenous Q12H  . thiamine IV  100 mg Intravenous Daily   acetaminophen, fentaNYL (SUBLIMAZE) injection, ondansetron (ZOFRAN) IV, sodium chloride  Assessment/ Plan:  64 y.o. female with a PMHX of morbid obesity, DVT, PE, IVC filter, GERD, recent gastric bypass procedure on 8/22 at [facility], was admitted on 04/13/2015 with cardiac arrest, acute renal failure, severe coagulopathy.  1. Acute renal failure due to ATN . Baseline creatinine 0.6 on August 22 -No new Cr this AM, but good UOP noted.  No indication for HD as before, would avoid nephrotoxins as possible.  2. Acute respiratory failure - continue vent support, fio2 currently 30%.   3. Septic shock: continue meropenem.  4. Recent LAPAROSCOPIC BILIOPANCREATIC DIVERSION, DUODENAL SWITCH AND HIATAL HERNIA REPAIR s/p exp lap 04/15/15  5.  Hypokalemia:  Repeat bmp this AM.   Paige [name] 9/1/20167:46 AM

## 2015-04-17 NOTE — Progress Notes (Signed)
Patient ID: Paige [name], female   DOB: Jun 29, 1951, 64 y.o.   MRN: [id number] [facility] PROGRESS NOTE  PCP: [name], MD  HPI/Subjective: Patient seen earlier today. Patient unresponsive to painful stimuli  Objective: Filed Vitals:   04/17/15 1400  BP: 110/74  Pulse:   Temp: 99.3 F (37.4 C)  Resp: 16    Filed Weights   04/13/15 1500 04/15/15 0500 04/17/15 0500  Weight: 173 kg (381 lb 6.3 oz) 178.3 kg (393 lb 1.3 oz) 171.7 kg (378 lb 8.5 oz)    ROS: Review of Systems  Unable to perform ROS  patient is unresponsive to painful stimuli Exam: Physical Exam  HENT:  Nose: No mucosal edema.  Unable Lukens mouth  Eyes: Conjunctivae and lids are normal. Right pupil is not reactive. Left pupil is not reactive.  Neck: No JVD present. Carotid bruit is not present. No edema present. No thyroid mass and no thyromegaly present.  Cardiovascular: S1 normal and S2 normal.  Exam reveals no gallop.   Murmur heard.  Systolic murmur is present with a grade of 2/6  Pulses:      Dorsalis pedis pulses are 2+ on the right side, and 2+ on the left side.  Respiratory: No respiratory distress. She has decreased breath sounds in the right lower field and the left lower field. She has no wheezes. She has no rhonchi. She has no rales.  GI: Soft. Bowel sounds are normal. There is no tenderness.  Musculoskeletal:       Right ankle: She exhibits no swelling.       Left ankle: She exhibits no swelling.  Lymphadenopathy:    She has no cervical adenopathy.  Neurological:  Unresponsive to painful stimuli. Pupils fixed and dilated. No corneal reflex.  Skin: Skin is warm. No rash noted. Nails show no clubbing.  Psychiatric:  Unable to test secondary to unresponsive to painful stimuli    Data Reviewed: Basic Metabolic Panel:  Recent Labs Lab 04/13/15 0625  04/13/15 2157 04/14/15 0504 04/15/15 0446 04/16/15 0547 04/17/15 0837  NA 145  < > 141 142 143 145 148*  K 4.7  < >  3.7 3.8 3.1* 5.3* 3.1*  CL 108  < > 110 110 113* 116* 118*  CO2 21*  < > 21* 21* 23 25 23   GLUCOSE 189*  < > 141* 138* 146* 103* 114*  BUN 78*  < > 75* 78* 65* 59* 36*  CREATININE 4.62*  < > 3.48* 3.18* 1.94* 1.55* 1.03*  CALCIUM 7.9*  < > 7.5* 7.7* 7.8* 7.3* 7.5*  MG 2.7*  --   --   --   --   --   --   < > = values in this interval not displayed. Liver Function Tests:  Recent Labs Lab 04/13/15 1130 04/14/15 0504 04/15/15 0446 04/16/15 0547  AST 179* 85* 48* 66*  ALT 90* 62* 49 52  ALKPHOS 64 53 62 71  BILITOT 1.8* 3.2* 2.0* 3.9*  PROT 5.6* 5.9* 5.6* 5.2*  ALBUMIN 1.8* 2.0* 1.8* 1.5*   CBC:  Recent Labs Lab 04/14/15 1513 04/14/15 2321 04/15/15 0446 04/16/15 0547 04/17/15 0930  WBC 18.2* 17.5* 18.6* 22.2* 11.8*  HGB 10.3* 9.5* 9.4* 9.5* 8.6*  HCT 31.5* 29.1* 28.6* 30.7* 27.3*  MCV 79.5* 79.4* 80.0 84.4 81.9  PLT 121* 95* 95* 69* 64*     Recent Results (from the past 240 hour(s))  MRSA PCR Screening     Status: Abnormal   Collection Time:  04/13/15 11:30 AM  Result Value Ref Range Status   MRSA by PCR POSITIVE (A) NEGATIVE Final    Comment: [facility] CALLED CHRISTINA MILES AT 1740 04/13/15        The GeneXpert MRSA Assay (FDA approved for NASAL specimens only), is one component of a comprehensive MRSA colonization surveillance program. It is not intended to diagnose MRSA infection nor to guide or monitor treatment for MRSA infections.   Culture, blood (routine x 2)     Status: None   Collection Time: 04/13/15 12:45 PM  Result Value Ref Range Status   Specimen Description BLOOD LEFT HAND  Final   Special Requests   Final    BOTTLES DRAWN AEROBIC AND ANAEROBIC  AER 2CC ANA 1CC   Culture  Setup Time   Final    GRAM NEGATIVE RODS AEROBIC BOTTLE ONLY CRITICAL RESULT CALLED TO, READ BACK BY AND VERIFIED WITH: Cicero Duck TAYLOR 04/14/2015 0515 [facility] CONFIRMED BY [facility]    Culture   Final    KLEBSIELLA PNEUMONIAE IN BOTH AEROBIC AND ANAEROBIC BOTTLES    Report Status  04/17/2015 FINAL  Final   Organism ID, Bacteria KLEBSIELLA PNEUMONIAE  Final      Susceptibility   Klebsiella pneumoniae - MIC*    AMPICILLIN 16 RESISTANT Resistant     CEFTAZIDIME <=1 SENSITIVE Sensitive     CEFAZOLIN <=4 SENSITIVE Sensitive     CEFTRIAXONE <=1 SENSITIVE Sensitive     CIPROFLOXACIN <=0.25 SENSITIVE Sensitive     GENTAMICIN <=1 SENSITIVE Sensitive     IMIPENEM <=0.25 SENSITIVE Sensitive     TRIMETH/SULFA <=20 SENSITIVE Sensitive     PIP/TAZO Value in next row Sensitive      SENSITIVE<=4    * KLEBSIELLA PNEUMONIAE  Culture, blood (routine x 2)     Status: None   Collection Time: 04/13/15  1:05 PM  Result Value Ref Range Status   Specimen Description BLOOD RIGHT HAND  Final   Special Requests BOTTLES DRAWN AEROBIC AND ANAEROBIC  4CC  Final   Culture  Setup Time   Final    GRAM NEGATIVE RODS IN BOTH AEROBIC AND ANAEROBIC BOTTLES CRITICAL RESULT CALLED TO, READ BACK BY AND VERIFIED WITH: Cicero Duck TAYLOR 04/14/2015 0445 [facility]    Culture   Final    KLEBSIELLA PNEUMONIAE IN BOTH AEROBIC AND ANAEROBIC BOTTLES CONFIRMED BY [facility]    Report Status 04/16/2015 FINAL  Final   Organism ID, Bacteria KLEBSIELLA PNEUMONIAE  Final      Susceptibility   Klebsiella pneumoniae - MIC*    AMPICILLIN 16 RESISTANT Resistant     CEFTAZIDIME <=1 SENSITIVE Sensitive     CEFAZOLIN <=4 SENSITIVE Sensitive     CEFTRIAXONE <=1 SENSITIVE Sensitive     CIPROFLOXACIN <=0.25 SENSITIVE Sensitive     GENTAMICIN <=1 SENSITIVE Sensitive     IMIPENEM <=0.25 SENSITIVE Sensitive     TRIMETH/SULFA <=20 SENSITIVE Sensitive     PIP/TAZO Value in next row Sensitive      SENSITIVE<=4    * KLEBSIELLA PNEUMONIAE     Studies: Dg Chest Port 1 View  04/16/2015   CLINICAL DATA:  Follow-up respiratory failure common recent sepsis, shock, and cardiac arrest  EXAM: PORTABLE CHEST - 1 VIEW  COMPARISON:  Portable chest x-ray of April 15, 2015.  FINDINGS: The lungs remain hypoinflated. The interstitial markings are  normal today. The cardiac silhouette remains enlarged. The pulmonary vascularity is less engorged and more distinct. There is no significant pleural effusion. There is no pneumothorax. The  endotracheal tube tip lies 3.3 cm above the carina. The esophagogastric tube tip projects below the level of the GE junction. The right internal jugular venous catheter tip projects at the junction of the middle and distal thirds of the SVC. The bony thorax is unremarkable. A stable tubular structure projects over the right upper quadrant of the abdomen.  IMPRESSION: Interval improvement in the appearance of the pulmonary interstitium with decreased pulmonary vascular congestion. The support tubes are stable.   Electronically Signed   By: David  [place] M.D.   On: 04/16/2015 07:05   Dg Chest Port 1 View  04/15/2015   CLINICAL DATA:  Central line placement  EXAM: PORTABLE CHEST - 1 VIEW  COMPARISON:  04/14/2015  FINDINGS: Endotracheal tube is appropriately positioned. Nasogastric tube tip terminates below the level of the diaphragms but is not included in the field of view. Right IJ central line terminates over the distal SVC. No pneumothorax. No other change.  IMPRESSION: Right IJ central line placement with tip over distal SVC.   Electronically Signed   By: Christiana Pellant M.D.   On: 04/15/2015 21:42   Dg Chest Port 1 View  04/15/2015   Ricarda Frame, MD     04/15/2015  8:14 PM Central Venous Catheter Insertion Procedure Note AVERIANA CLOUATRE [id number] 12/05/1950  Procedure: Insertion of Central Venous Catheter Indications: Drug and/or fluid administration  Procedure Details Consent: Unable to obtain consent because of altered level of  consciousness. and medical necesity Procedure performed as part  of operation performed by Dr. Smitty Cords. Time Out: Verified patient identification, verified procedure,  site/side was marked, verified correct patient position, special  equipment/implants available, medications/allergies/relevent   history reviewed, required imaging and test results available.   Performed  Maximum sterile technique was used including antiseptics, cap,  gloves, gown, hand hygiene, mask and sheet. Skin prep: Chlorhexidine; local anesthetic administered A antimicrobial bonded/coated triple lumen catheter was placed in  the right internal jugular vein using the Seldinger technique.  Evaluation Blood flow good Complications: No apparent complications Patient did tolerate procedure well. Chest X-ray ordered to verify placement.  CXR: pending.  Ricarda Frame 04/15/2015, 8:11 PM     Scheduled Meds: . amantadine  100 mg Oral BID  . antiseptic oral rinse  7 mL Mouth Rinse QID  . chlorhexidine gluconate  15 mL Mouth Rinse BID  . feeding supplement (VITAL HIGH PROTEIN)  1,000 mL Per Tube Q24H  . insulin aspart  0-15 Units Subcutaneous 6 times per day  . lacosamide (VIMPAT) IV  50 mg Intravenous Q12H  . meropenem (MERREM) IV  1 g Intravenous Q8H  . mupirocin ointment  1 application Nasal BID  . pantoprazole (PROTONIX) IV  40 mg Intravenous Q12H  . potassium chloride  10 mEq Intravenous Q1 Hr x 4  . sodium chloride  3 mL Intravenous Q12H  . thiamine IV  100 mg Intravenous Daily   Continuous Infusions: . dextrose 50 mL/hr at 04/16/15 2100  . norepinephrine Stopped (04/17/15 0600)    Assessment/Plan:  1. Shock- likely combination of cardiogenic, hemorrhagic and septic. Patient is now off Levophed. 2. Cardiac arrest- likely due to dehiscence of the anastomosis with leaking of bowel contents into the abdomen. Continue supportive care. 3. Acute encephalopathy- patient is unresponsive to painful stimuli. Neurology once to give the patient more time. Overall prognosis is going to be linked to the patient's mental status. This could be anoxic encephalopathy versus toxic encephalopathy 4. Acute respiratory failure- full ventilator support is  going to be needed until mental status improves. 5. Gastrointestinal bleed-  continue to monitor closely, last hemoglobin 8.6 6. Sepsis with Klebsiella- continue IV meropenem. White count trending a little bit better. Likely secondary to dehiscence of the anastomosis. . Patient is status surgery for correction of this. 7. Acute renal failure- creatinine has now normalized 8. Coagulopathy- INR trending better. Likely secondary to sepsis. 9. Complication of recent gastric bypass surgery with dehiscence of anastomosis site requiring operative repair.   Code Status:     Code Status Orders        Start     Ordered   04/14/15 1745  Limited resuscitation (code)   Continuous    Comments:  May administer /continue pressors and antiarythmics --but not in the event of asystole or PEA.  (Ok to treat tachycardia or bradycardia or AFib/flutter).  Question Answer Comment  In the event of cardiac or respiratory ARREST: Initiate Code Blue, Call Rapid Response No   In the event of cardiac or respiratory ARREST: Perform CPR No   In the event of cardiac or respiratory ARREST: Perform Intubation/Mechanical Ventilation Yes   In the event of cardiac or respiratory ARREST: Use NIPPV/BiPAp only if indicated Yes   In the event of cardiac or respiratory ARREST: Administer ACLS medications if indicated No   In the event of cardiac or respiratory ARREST: Perform Defibrillation or Cardioversion if indicated No      04/14/15 1745     Family Communication: Daughter was at the bedside while I was seeing the patient  Disposition Plan: To be determined depending on the patient's mental status   Time spent: 35 minutes critical care time  Alford Highland  [facility]

## 2015-04-17 NOTE — Progress Notes (Signed)
PULMONARY / CRITICAL CARE MEDICINE   Name: Paige [name] MRN: [id number] DOB: 10-06-1950    ADMISSION DATE:  04-21-15  PT PROFILE:  68 F underwent bariatric surgery 8/22 at [facility]. Noted to have had normal renal function 8/24. Was discharged to home 8/26. EMS called to home 2023/04/21 for dyspnea. Coded in ambulance. Approx 10 mins ACLS. Admitted to ICU with profound coagulopathy (was discharged to home on enoxaparin 120 mg BID), shock, atrial fibrillation, VRDF, AKI, post anoxic encephalopathy. She has a PMH of DVT, s/p IVC filter, recent gastric ulcer  MAJOR EVENTS/TEST RESULTS: 2023-04-21 Echocardiogram: EF 35% 8/29 CT head: Limited motion degraded study. Poor gray-white differentiation, possible cytotoxic edema from hypoxic ischemic injury. No intracranial hemorrhage or herniation 8/29 Echocardiogram: The LV cavity size was severely dilated. Systolic function was moderately reduced. The estimated ejection fraction was 35%. Akinesis of the anterior myocardium. Hypokinesis of the inferolateral myocardium. Akinesis of the apical myocardium  8/30 EEG: EEG with moderate slowing 8/30 repeat CT head: Motion degraded study. Note definite infarct, although the gray-white differentiation is difficult to evaluate in the cerebral hemispheres due to motion degradation 8/30 remains comatose. Off vasopressors. Renal function improving 8/30 Exploratory laparoscopy converted to laparotomy: repair of anastomotic leak, washout of abscesses. Back on vasopressors post op 8/31, 9/01 No significant neurological improvement. Off vasopressors 9/01  INDWELLING DEVICES:: ETT 04-21-2023 >>  R IJ CVL 04/21/2023 >> 8/30 R femoral A-line 2023-04-21 >> 8/30 R IJ CVL 8/30 >>  L radiall A-line 8/30 >>   MICRO DATA: Blood 04-21-2023 >> Klebsiella Blood 8/31 >>    ANTIMICROBIALS:    Pip-tazo 2023-04-21 >> 8/30 Micafungin 8/30 >> 8/31 Meropenem 8/30 >>    SUBJ/INTERVAL: Remains minimally responsive off all sedation  VITAL  SIGNS: Temp:  [99.3 F (37.4 C)-100.6 F (38.1 C)] 99.3 F (37.4 C) (09/01 1400) Pulse Rate:  [31-121] 98 (09/01 0800) Resp:  [16-29] 16 (09/01 1400) BP: (100-124)/(50-85) 110/74 mmHg (09/01 1400) SpO2:  [95 %-100 %] 100 % (09/01 0800) Arterial Line BP: (95-136)/(45-62) 124/56 mmHg (09/01 1400) FiO2 (%):  [30 %] 30 % (09/01 0430) Weight:  [171.7 kg (378 lb 8.5 oz)] 171.7 kg (378 lb 8.5 oz) (09/01 0500) HEMODYNAMICS: CVP:  [9 mmHg-16 mmHg] 13 mmHg VENTILATOR SETTINGS: Vent Mode:  [-] PRVC FiO2 (%):  [30 %] 30 % Set Rate:  [16 bmp] 16 bmp Vt Set:  [450 mL] 450 mL PEEP:  [5 cmH20] 5 cmH20 INTAKE / OUTPUT:  Intake/Output Summary (Last 24 hours) at 04/17/15 1500 Last data filed at 04/17/15 1408  Gross per 24 hour  Intake 2059.1 ml  Output   4640 ml  Net -2580.9 ml    PHYSICAL EXAMINATION: General: Morbidly obese, comatose, intubated Neuro: PERRL, corneals absent, no spontaneous movement, no withdrawal from pain HEENT: WNL Cardiovascular: IRIR, no M noted Lungs: clear anteriorly Abdomen: Markedly obese, mildly firm, surgical wound dressed Ext: 1 - 2+ symmetric LE edema  LABS:  CBC  Recent Labs Lab 04/15/15 0446 04/16/15 0547 04/17/15 0930  WBC 18.6* 22.2* 11.8*  HGB 9.4* 9.5* 8.6*  HCT 28.6* 30.7* 27.3*  PLT 95* 69* 64*   Coag's  Recent Labs Lab 04/21/15 1718 21-Apr-2015 2344 04/14/15 0504 04/15/15 0446 04/16/15 0800  APTT 45* 42* 41*  --   --   INR 1.93 1.76 1.69 1.43 1.69   BMET  Recent Labs Lab 04/15/15 0446 04/16/15 0547 04/17/15 0837  NA 143 145 148*  K 3.1* 5.3* 3.1*  CL 113* 116*  118*  CO2 23 25 23   BUN 65* 59* 36*  CREATININE 1.94* 1.55* 1.03*  GLUCOSE 146* 103* 114*   Electrolytes  Recent Labs Lab 04/13/15 0625  04/15/15 0446 04/16/15 0547 04/17/15 0837  CALCIUM 7.9*  < > 7.8* 7.3* 7.5*  MG 2.7*  --   --   --   --   < > = values in this interval not displayed. Sepsis Markers  Recent Labs Lab 04/13/15 0642 04/13/15 1130  04/14/15 0504  LATICACIDVEN 6.3* 1.7 1.8  PROCALCITON  --   --  66.58   ABG  Recent Labs Lab 04/13/15 0639 04/13/15 1400 04/14/15 0517  PHART 7.29* 7.35 7.44  PCO2ART 37 34 30*  PO2ART 174* 209* 92   Liver Enzymes  Recent Labs Lab 04/14/15 0504 04/15/15 0446 04/16/15 0547  AST 85* 48* 66*  ALT 62* 49 52  ALKPHOS 53 62 71  BILITOT 3.2* 2.0* 3.9*  ALBUMIN 2.0* 1.8* 1.5*   Cardiac Enzymes  Recent Labs Lab 04/13/15 1718 04/13/15 2157 04/14/15 0504  TROPONINI <0.03 <0.03 0.03   Glucose  Recent Labs Lab 04/16/15 1602 04/16/15 2000 04/17/15 0009 04/17/15 0449 04/17/15 0739 04/17/15 1135  GLUCAP 96 101* 101* 104* 105* 97    CXR: NNF   ASSESSMENT / PLAN:  CARDIOVASCULAR A: Out of hospital cardiac arrest 8/28 Septic shock, resolved New onset AF, rate controlled P:  CVP goal 10-14 mmHg MAP goal > 65 mmHg  PULMONARY A: VDRF post arrest Poor cognition prohibits extubation 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 No further bleeding evident Thrombocytopenia P:  DVT px: SCDs Monitor CBC intermittently Transfuse per usual guidelines  INFECTIOUS A:   Klebsiella bacteremia  Peritonitis with intra-abdominal abscesses P:   Monitor temp, WBC count Micro and abx as above  ENDOCRINE A:   Hyperglycemia without hx of DM - resolved P:   DC SSI 9/01. Resume for CBG > 180 Change CBGs to q 8 hrs  NEUROLOGIC A:    Acute encephalopathy - TME and/or post anoxic P:   RASS goal: 0, -1 Minimize sedatives Neurology following. Recommends support through [facility]   FAMILY  Daughter and son in law updated in detail.   CCM time 30 mins   Billy Fischer, MD PCCM service Mobile [phone] Pager [phone]

## 2015-04-17 NOTE — Progress Notes (Signed)
Surgery Progress Note  S: Increased J tube output.  Increased output from Left upper drain (subdiaphragmatic). Off pressors. O:Blood pressure 111/68, pulse 98, temperature 99.5 F (37.5 C), temperature source Core (Comment), resp. rate 18, height  (1.6 m), weight 378 lb 8.5 oz (171.7 kg), SpO2 100 %. GEN: NAD/unresponsive to sternal rub ABD: soft, nontender, nondistended, incision c/d/i Drains: JP - dark brown liquid JP RU/RL/LL - serosang JP LU - dark red  A/P 64 yo s/p ex lap for anastamotic leak, primary repair, J tube - labs - begin tube feeds slowly - although subphrenic drain has increased, I feel that it does not look like J tube drainage.  Will continue to watch when beginning tube feeds to ensure that this is not jejunostomy drainage - I have spoken with Dr. Smitty Cords about findings, he agrees with this plan.

## 2015-04-17 NOTE — Progress Notes (Signed)
SUBJECTIVE: pt remains intubated and sedated   Filed Vitals:   04/17/15 0500 04/17/15 0600 04/17/15 0700 04/17/15 0800  BP: 105/75 119/72 120/75 111/68  Pulse: 105 103 91 98  Temp: 99.9 F (37.7 C) 99.7 F (37.6 C) 99.5 F (37.5 C) 99.5 F (37.5 C)  TempSrc:      Resp: Height:      Weight: 171.7 kg (378 lb 8.5 oz)     SpO2: 100% 100% 100% 100%    Intake/Output Summary (Last 24 hours) at 04/17/15 0835 Last data filed at 04/17/15 0800  Gross per 24 hour  Intake 3474.1 ml  Output   3555 ml  Net  -80.9 ml    LABS: Basic Metabolic Panel:  Recent Labs  40/98/11 0446 04/16/15 0547  NA 143 145  K 3.1* 5.3*  CL 113* 116*  CO2 23 25  GLUCOSE 146* 103*  BUN 65* 59*  CREATININE 1.94* 1.55*  CALCIUM 7.8* 7.3*   Liver Function Tests:  Recent Labs  04/15/15 0446 04/16/15 0547  AST 48* 66*  ALT 49 52  ALKPHOS 62 71  BILITOT 2.0* 3.9*  PROT 5.6* 5.2*  ALBUMIN 1.8* 1.5*   No results for input(s): LIPASE, AMYLASE in the last 72 hours. CBC:  Recent Labs  04/15/15 0446 04/16/15 0547  WBC 18.6* 22.2*  HGB 9.4* 9.5*  HCT 28.6* 30.7*  MCV 80.0 84.4  PLT 95* 69*   Cardiac Enzymes: No results for input(s): CKTOTAL, CKMB, CKMBINDEX, TROPONINI in the last 72 hours. BNP: Invalid input(s): POCBNP D-Dimer: No results for input(s): DDIMER in the last 72 hours. Hemoglobin A1C: No results for input(s): HGBA1C in the last 72 hours. Fasting Lipid Panel: No results for input(s): CHOL, HDL, LDLCALC, TRIG, CHOLHDL, LDLDIRECT in the last 72 hours. Thyroid Function Tests:  Recent Labs  04/14/15 1513  TSH 5.380*   Anemia Panel:  Recent Labs  04/14/15 1513  VITAMINB12 3600*  FOLATE 5.3*     PHYSICAL EXAM General: obese, critically ill appearing HEENT: Normocephalic and atramatic Neck: No JVD.  Lungs: rhonchi b/l Heart: irregularly irregular  TELEMETRY: Reviewed telemetry pt in a-fib VR in 90s  ASSESSMENT AND PLAN:  pt s/p cardiac arrest,  troponin negative x3. 2/2 septic shock.  CHF: Echo shows decreased EF (35%). Consider Bb if prognosis improves.   Atrial fibrillation: Amiodarone gtt never started 2/2 hypotension, will d/c order. Rate controlled.   Principal Problem:   Cardiac arrest Active Problems:   Acute renal failure   Acute respiratory failure   Acute GI bleeding   Shock   Severe sepsis    Paige [name] A, MD, [facility] 04/17/2015 8:35 AM

## 2015-04-17 NOTE — Progress Notes (Signed)
Pharmacy Consult for Meropenem Indication: Klebsiella bacteremia/peritonitis with intra-abdominal abscesses   Allergies  Allergen Reactions  . Lisinopril Swelling    This medication makes patient lips swell.    Patient Measurements: Height:  (160 cm) Weight: (!) 378 lb 8.5 oz (171.7 kg) IBW/kg (Calculated) : 52.4   Vital Signs: Temp: 99.3 F (37.4 C) (09/01 1200) Temp Source: Core (Comment) (09/01 0400) BP: 116/81 mmHg (09/01 1200) Pulse Rate: 98 (09/01 0800) Intake/Output from previous day: 08/31 0701 - 09/01 0700 In: 3364.1 [I.V.:3024.1; IV Piggyback:160] Out: 3675 [Urine:2550; Drains:1125] Intake/Output from this shift: Total I/O In: 230 [I.V.:50; IV Piggyback:180] Out: 965 [Urine:525; Drains:440]  Labs:  Recent Labs  04/15/15 0446 04/16/15 0547 04/17/15 0837 04/17/15 0930  WBC 18.6* 22.2*  --  11.8*  HGB 9.4* 9.5*  --  8.6*  PLT 95* 69*  --  64*  CREATININE 1.94* 1.55* 1.03*  --    Estimated Creatinine Clearance: 87.2 mL/min (by C-G formula based on Cr of 1.03). No results for input(s): VANCOTROUGH, VANCOPEAK, VANCORANDOM, GENTTROUGH, GENTPEAK, GENTRANDOM, TOBRATROUGH, TOBRAPEAK, TOBRARND, AMIKACINPEAK, AMIKACINTROU, AMIKACIN in the last 72 hours.   Microbiology: Recent Results (from the past 720 hour(s))  MRSA PCR Screening     Status: Abnormal   Collection Time: 04/13/15 11:30 AM  Result Value Ref Range Status   MRSA by PCR POSITIVE (A) NEGATIVE Final    Comment: [facility] CALLED CHRISTINA MILES AT 1740 04/13/15        The GeneXpert MRSA Assay (FDA approved for NASAL specimens only), is one component of a comprehensive MRSA colonization surveillance program. It is not intended to diagnose MRSA infection nor to guide or monitor treatment for MRSA infections.   Culture, blood (routine x 2)     Status: None   Collection Time: 04/13/15 12:45 PM  Result Value Ref Range Status   Specimen Description BLOOD LEFT HAND  Final   Special Requests   Final    BOTTLES DRAWN AEROBIC AND ANAEROBIC  AER 2CC ANA 1CC   Culture  Setup Time   Final    GRAM NEGATIVE RODS AEROBIC BOTTLE ONLY CRITICAL RESULT CALLED TO, READ BACK BY AND VERIFIED WITH: Cicero Duck TAYLOR 04/14/2015 0515 [facility] CONFIRMED BY [facility]    Culture   Final    KLEBSIELLA PNEUMONIAE IN BOTH AEROBIC AND ANAEROBIC BOTTLES    Report Status 04/17/2015 FINAL  Final   Organism ID, Bacteria KLEBSIELLA PNEUMONIAE  Final      Susceptibility   Klebsiella pneumoniae - MIC*    AMPICILLIN 16 RESISTANT Resistant     CEFTAZIDIME <=1 SENSITIVE Sensitive     CEFAZOLIN <=4 SENSITIVE Sensitive     CEFTRIAXONE <=1 SENSITIVE Sensitive     CIPROFLOXACIN <=0.25 SENSITIVE Sensitive     GENTAMICIN <=1 SENSITIVE Sensitive     IMIPENEM <=0.25 SENSITIVE Sensitive     TRIMETH/SULFA <=20 SENSITIVE Sensitive     PIP/TAZO Value in next row Sensitive      SENSITIVE<=4    * KLEBSIELLA PNEUMONIAE  Culture, blood (routine x 2)     Status: None   Collection Time: 04/13/15  1:05 PM  Result Value Ref Range Status   Specimen Description BLOOD RIGHT HAND  Final   Special Requests BOTTLES DRAWN AEROBIC AND ANAEROBIC  4CC  Final   Culture  Setup Time   Final    GRAM NEGATIVE RODS IN BOTH AEROBIC AND ANAEROBIC BOTTLES CRITICAL RESULT CALLED TO, READ BACK BY AND VERIFIED WITH: Cicero Duck TAYLOR 04/14/2015 0445 [facility]  Culture   Final    KLEBSIELLA PNEUMONIAE IN BOTH AEROBIC AND ANAEROBIC BOTTLES CONFIRMED BY [facility]    Report Status 04/16/2015 FINAL  Final   Organism ID, Bacteria KLEBSIELLA PNEUMONIAE  Final      Susceptibility   Klebsiella pneumoniae - MIC*    AMPICILLIN 16 RESISTANT Resistant     CEFTAZIDIME <=1 SENSITIVE Sensitive     CEFAZOLIN <=4 SENSITIVE Sensitive     CEFTRIAXONE <=1 SENSITIVE Sensitive     CIPROFLOXACIN <=0.25 SENSITIVE Sensitive     GENTAMICIN <=1 SENSITIVE Sensitive     IMIPENEM <=0.25 SENSITIVE Sensitive     TRIMETH/SULFA <=20 SENSITIVE Sensitive     PIP/TAZO Value in next row Sensitive       SENSITIVE<=4    * KLEBSIELLA PNEUMONIAE    Medical History: Past Medical History  Diagnosis Date  . Blood clot in vein 01/2012  . Arthritis   . Heart disease   . Hemorrhoids   . Hypertension   . GERD (gastroesophageal reflux disease)   . Peptic ulcer disease     Medications:  Scheduled:  . antiseptic oral rinse  7 mL Mouth Rinse QID  . chlorhexidine gluconate  15 mL Mouth Rinse BID  . feeding supplement (VITAL HIGH PROTEIN)  1,000 mL Per Tube Q24H  . insulin aspart  0-15 Units Subcutaneous 6 times per day  . lacosamide (VIMPAT) IV  50 mg Intravenous Q12H  . meropenem (MERREM) IV  1 g Intravenous Q8H  . mupirocin ointment  1 application Nasal BID  . pantoprazole (PROTONIX) IV  40 mg Intravenous Q12H  . potassium chloride  10 mEq Intravenous Q1 Hr x 4  . sodium chloride  3 mL Intravenous Q12H  . thiamine IV  100 mg Intravenous Daily   Infusions:  . dextrose 50 mL/hr at 04/16/15 2100  . norepinephrine Stopped (04/17/15 0600)   PRN: acetaminophen, fentaNYL (SUBLIMAZE) injection, ondansetron (ZOFRAN) IV  Assessment: 64 y/o F with s/p gastric bypass surgery with peritonitis and intraabdominal abscesses. Blood cultures growing Klebsiella. Renal function is improving.   Plan:  Will increase meropenem dosing to 1 g iv q 8 h for improving renal function and continue to follow.   Luisa Hart D 04/17/2015,1:57 PM

## 2015-04-17 NOTE — Progress Notes (Signed)
   04/17/15 0954  Clinical Encounter Type  Visited With Patient;Health care provider  Visit Type Spiritual support  Consult/Referral To Chaplain  Spiritual Encounters  Spiritual Needs Emotional  Stress Factors  Patient Stress Factors None identified  Chaplain rounded in unit and offered a compassionate presence and support to patient and staff as applicable. Chaplain Adonai Helzer A. Sahmya Arai Ext. [phone]

## 2015-04-17 NOTE — Progress Notes (Signed)
NEUROLOGY NOTE  S: no changes per nursing today  ROS unobtainable secondary to mental status  O: 98.7 104*72 85 18 Obese, moderate distress Normocephalic, oropharynx clear Supple, nl JVD CTA B but there is some wheezing from trachia RRR, no murmur No C/C/E  Intubated, not sedated, opens to pain but does not track or follow, GCS 5T PERRLA, + corneals B, slight L gaze, good cough Still weak flexion B to pain  A/P: 1. Encephalopathy- Again unchanged but septic abdomin could cause severe encephalopathy and she is still actively dealing with this. On eeg this appears to be more toxic/metabolic and infectious related; The typical pattern of burst suppression with anoxic injury is not seen. Pt has had trace improvement in exam and is now off of all pressors. - start Amantadine  BID PO - Repeat EEG tomorrow - Would give until next week to see if there is improvement  - Will follow

## 2015-04-17 NOTE — Progress Notes (Signed)
Patient afib on monitor. Vitals stable- Patient off levophed since 6am-Patient only withdraw with suctioning through ET tube.  All JP drains intact and output minimal- JTube drain now used for tube feeding per Dr. Juliann Pulse- Both dressings changed and are dry and intact. Foley in place- urine output adequate. Potassium replaced this am- recheck tomorrow.Plan for EEG in am.

## 2015-04-17 NOTE — Progress Notes (Signed)
Nutrition Follow-up     INTERVENTION:  EN: Per Dr. Juliann Pulse wanting to start trophic enteral nutrition at 60ml/hr today via J tube.  Recommend vital high protein to start at 40ml/hr today.  Nursing already flushing J tube 60ml q 6 hr per Dr. Smitty Cords order no additional free water flush included at this time.  Recommend checking mg and phos in am   NUTRITION DIAGNOSIS:   Inadequate oral intake related to altered GI function as evidenced by NPO status.    GOAL:   Provide needs based on ASPEN/SCCM guidelines    MONITOR:    (Energy intake, Electrolyte and renal profile, digestive system)  REASON FOR ASSESSMENT:   Consult Enteral/tube feeding initiation and management  ASSESSMENT:      Pt remains on vent.   Current Nutrition: NPO    Gastrointestinal Profile: J tube drainage in last 12 hr this am of (flushing tube with water 60ml q 6 hrs). Medications given via J tube today as well Drainage noted from additional drains   Medications: reviewed  Electrolyte/Renal Profile and Glucose Profile:   Recent Labs Lab 04/13/15 0625  04/15/15 0446 04/16/15 0547 04/17/15 0837  NA 145  < > 143 145 148*  K 4.7  < > 3.1* 5.3* 3.1*  CL 108  < > 113* 116* 118*  CO2 21*  < > BUN 78*  < > 65* 59* 36*  CREATININE 4.62*  < > 1.94* 1.55* 1.03*  CALCIUM 7.9*  < > 7.8* 7.3* 7.5*  MG 2.7*  --   --   --   --   GLUCOSE 189*  < > 146* 103* 114*  < > = values in this interval not displayed. Protein Profile:  Recent Labs Lab 04/14/15 0504 04/15/15 0446 04/16/15 0547  ALBUMIN 2.0* 1.8* 1.5*     Nutrition-Focused Physical Exam Findings:  Unable to complete Nutrition-Focused physical exam at this time.     Weight Trend since Admission: Filed Weights   04/13/15 1500 04/15/15 0500 04/17/15 0500  Weight: 381 lb 6.3 oz (173 kg) 393 lb 1.3 oz (178.3 kg) 378 lb 8.5 oz (171.7 kg)      Diet Order:  Diet NPO time specified  Skin:   reviewed    Height:   Ht  Readings from Last 1 Encounters:  04/13/15  (1.6 m)    Weight:   Wt Readings from Last 1 Encounters:  04/17/15 378 lb 8.5 oz (171.7 kg)     BMI:  Body mass index is 67.07 kg/(m^2).  Estimated Nutritional Needs:   Kcal:  (22-25 kcals/kg IBW) 1144-1300 kcals/d (Using IBW of 52kg)  Protein:  (2.0-2.5 g/IBW) 104-130 g/d  Fluid:  (25-71ml/kg) 1300-1525ml/d  EDUCATION NEEDS:   No education needs identified at this time  HIGH Care Level  Paige [name], RD, LDN [phone] (pager).

## 2015-04-17 NOTE — Progress Notes (Signed)
Palliative Care Update  At some point, the palliative care consult order was discontinued.  However, I checked with her doctors yesterday and they stated that this was unintentional, so this consult is added back. I had thought that there was some chance of her being transferred out yesterday, based on what was mentioned to me, but that appears not the be the case, so I am again checking on patient's condition and progress.     I am aware that at this time, the pt is to be followed for signs of some neurologic recovery --at least through the weekend-- at the advice of Dr. Katrinka Blazing, neurologist.    At no time have I used the phrase 'Brain Dead' (or any similar terminology indicating that I thought this to be the case) with family or others (this is unfortunately attributed to me in one note in the record).  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  It does not appear that I need to talk with family at this time --or even possibly into early next week, if at all.  We are all watching and waiting to see what recovery may (or may not) develop with surgical intervention and treatment of sepsis.  I will follow along at a distance by checking the chart and getting updates from her doctors.  I am available Mon-Fri to talk with family if it is thought that this will be helpful.  If I see family, I will let them know I am available to talk if needed, but I do not plan on regular supportive conversations with them unless I am asked to do so.   Cammie Mcgee, MD

## 2015-04-18 ENCOUNTER — Inpatient Hospital Stay: Payer: BLUE CROSS/BLUE SHIELD

## 2015-04-18 DIAGNOSIS — R7881 Bacteremia: Secondary | ICD-10-CM

## 2015-04-18 DIAGNOSIS — Z9889 Other specified postprocedural states: Secondary | ICD-10-CM

## 2015-04-18 DIAGNOSIS — R600 Localized edema: Secondary | ICD-10-CM

## 2015-04-18 DIAGNOSIS — D696 Thrombocytopenia, unspecified: Secondary | ICD-10-CM

## 2015-04-18 DIAGNOSIS — R4020 Unspecified coma: Secondary | ICD-10-CM

## 2015-04-18 DIAGNOSIS — I97121 Postprocedural cardiac arrest following other surgery: Secondary | ICD-10-CM

## 2015-04-18 DIAGNOSIS — G936 Cerebral edema: Secondary | ICD-10-CM

## 2015-04-18 LAB — MAGNESIUM: MAGNESIUM: 1.9 mg/dL (ref 1.7–2.4)

## 2015-04-18 LAB — CBC
HCT: 26.8 % — ABNORMAL LOW (ref 35.0–47.0)
Hemoglobin: 8.7 g/dL — ABNORMAL LOW (ref 12.0–16.0)
MCH: 26.8 pg (ref 26.0–34.0)
MCHC: 32.5 g/dL (ref 32.0–36.0)
MCV: 82.5 fL (ref 80.0–100.0)
PLATELETS: 84 10*3/uL — AB (ref 150–440)
RBC: 3.25 MIL/uL — ABNORMAL LOW (ref 3.80–5.20)
RDW: 18.5 % — AB (ref 11.5–14.5)
WBC: 9.5 10*3/uL (ref 3.6–11.0)

## 2015-04-18 LAB — BASIC METABOLIC PANEL
Anion gap: 3 — ABNORMAL LOW (ref 5–15)
BUN: 26 mg/dL — AB (ref 6–20)
CALCIUM: 7.8 mg/dL — AB (ref 8.9–10.3)
CHLORIDE: 121 mmol/L — AB (ref 101–111)
CO2: 27 mmol/L (ref 22–32)
CREATININE: 0.75 mg/dL (ref 0.44–1.00)
GFR calc Af Amer: >60 mL/min (ref >60)
Glucose, Bld: 117 mg/dL — ABNORMAL HIGH (ref 65–99)
Potassium: 3.5 mmol/L (ref 3.5–5.1)
SODIUM: 151 mmol/L — AB (ref 135–145)

## 2015-04-18 LAB — GLUCOSE, CAPILLARY
GLUCOSE-CAPILLARY: 110 mg/dL — AB (ref 65–99)
GLUCOSE-CAPILLARY: 114 mg/dL — AB (ref 65–99)
Glucose-Capillary: 101 mg/dL — ABNORMAL HIGH (ref 65–99)
Glucose-Capillary: 99 mg/dL (ref 65–99)

## 2015-04-18 LAB — PHOSPHORUS: PHOSPHORUS: 1.4 mg/dL — AB (ref 2.5–4.6)

## 2015-04-18 MED ORDER — INSULIN ASPART 100 UNIT/ML ~~LOC~~ SOLN: 0.0000 [IU] | SUBCUTANEOUS | Status: AC

## 2015-04-18 MED ORDER — TRACE MINERALS CR-CU-MN-SE-ZN 10-1000-500-60 MCG/ML IV SOLN
INTRAVENOUS | Status: DC
  Filled 2015-04-18: qty 600

## 2015-04-18 MED ORDER — DIGOXIN 0.25 MG/ML IJ SOLN
0.2500 mg | Freq: Once | INTRAMUSCULAR | Status: AC
  Administered 2015-04-18: 0.25 mg via INTRAVENOUS
  Filled 2015-04-18: qty 2

## 2015-04-18 MED ORDER — CHLORHEXIDINE GLUCONATE 0.12% ORAL RINSE (MEDLINE KIT): 15.0000 mL | Freq: Two times a day (BID) | OROMUCOSAL | Status: AC

## 2015-04-18 MED ORDER — INSULIN ASPART 100 UNIT/ML ~~LOC~~ SOLN: 0.0000 [IU] | SUBCUTANEOUS | Status: DC

## 2015-04-18 MED ORDER — POTASSIUM PHOSPHATES 15 MMOLE/5ML IV SOLN
30.0000 mmol | Freq: Once | INTRAVENOUS | Status: DC
  Administered 2015-04-18: 30 mmol via INTRAVENOUS
  Filled 2015-04-18: qty 10

## 2015-04-18 MED ORDER — LACOSAMIDE 200 MG/20ML IV SOLN: 50.0000 mg | Freq: Two times a day (BID) | INTRAVENOUS | Status: AC

## 2015-04-18 MED ORDER — ACETAMINOPHEN 650 MG RE SUPP: 650.0000 mg | RECTAL | Status: AC | PRN

## 2015-04-18 MED ORDER — POTASSIUM CL IN DEXTROSE 5% 20 MEQ/L IV SOLN
20.0000 meq | INTRAVENOUS | Status: DC
  Administered 2015-04-18: 20 meq via INTRAVENOUS
  Filled 2015-04-18 (×2): qty 1000

## 2015-04-18 MED ORDER — PANTOPRAZOLE SODIUM 40 MG IV SOLR: 40.0000 mg | Freq: Two times a day (BID) | INTRAVENOUS | Status: AC

## 2015-04-18 MED ORDER — ONDANSETRON 4 MG PO TBDP: 4.0000 mg | ORAL_TABLET | ORAL | Status: AC | PRN

## 2015-04-18 MED ORDER — MAGNESIUM SULFATE 2 GM/50ML IV SOLN
2.0000 g | Freq: Once | INTRAVENOUS | Status: AC
  Administered 2015-04-18: 2 g via INTRAVENOUS
  Filled 2015-04-18: qty 50

## 2015-04-18 MED ORDER — THIAMINE HCL 100 MG/ML IJ SOLN: 100.0000 mg | Freq: Every day | INTRAMUSCULAR | Status: AC

## 2015-04-18 MED ORDER — AMANTADINE HCL 50 MG/5ML PO SYRP: 100.0000 mg | ORAL_SOLUTION | Freq: Two times a day (BID) | ORAL | Status: AC

## 2015-04-18 MED ORDER — SODIUM CHLORIDE 0.9 % IV SOLN: 1.0000 g | Freq: Three times a day (TID) | INTRAVENOUS | Status: AC

## 2015-04-18 NOTE — Progress Notes (Signed)
Nutrition Follow-up    INTERVENTION:   PN: pt with electrolyte abnormalities, on D5 for hyperantremia. Recommend supplementing phosphorus. Recommend starting 5%AA/15%Dextrose at low rate of 25 ml/hr until electrolytes improved. Goal rate is 75 ml/hr providing 1800 mL, 90 g of protein and 1278 kcals. Discussed with Dr Ardyth Man. Received telephone order for TPN. Meets >85% protein needs, 100% estimated kcals. If electrolyte abnormalities worsen with initiation of TPN, recommend holding TPN and restarting once resolved.  Continue to assess   NUTRITION DIAGNOSIS:   Inadequate oral intake related to altered GI function as evidenced by NPO status.  GOAL:   Provide needs based on ASPEN/SCCM guidelines  MONITOR:    (Energy intake, Electrolyte and renal profile, digestive system)  REASON FOR ASSESSMENT:   Consult New TPN/TNA  ASSESSMENT:    Pt remains on vent, noted possible transfer to [facility] for further medical management. EEG today.   EN: TF held per MD order, noted per MD Lundquist note, drainage from tubes does not contain TF. TF only infusing at 10 ml/hr prior with no titration orders.   Digestive System: no vomiting, abdomen soft, J-tube with 325 mL, total of 880 mL from all drains.   Urine Volume: UOP 2125 mL  Electrolyte and Renal Profile:  Recent Labs Lab 04/13/15 0625  04/16/15 0547 04/17/15 0837 04/18/15 0503  BUN 78*  < > 59* 36*  36* 26*  CREATININE 4.62*  < > 1.55* 0.98  1.03* 0.75  NA 145  < > 145 149*  148* 151*  K 4.7  < > 5.3* 3.1*  3.1* 3.5  MG 2.7*  --   --   --  1.9  PHOS  --   --   --  1.7* 1.4*  < > = values in this interval not displayed. Glucose Profile:  Recent Labs  04/18/15 0003 04/18/15 0738 04/18/15 1135  GLUCAP 114* 101* 99   Nutritional Anemia Profile:  CBC Latest Ref Rng 04/18/2015 04/17/2015 04/16/2015  WBC 3.6 - 11.0 K/uL 9.5 11.8(H) 22.2(H)  Hemoglobin 12.0 - 16.0 g/dL 4.7(W) 2.9(F) 6.2(Z)  Hematocrit 35.0 - 47.0 % 26.8(L) 27.3(L)  30.7(L)  Platelets 150 - 440 K/uL 84(L) 64(L) 69(L)    Protein Profile:  Recent Labs Lab 04/15/15 0446 04/16/15 0547 04/17/15 0837  ALBUMIN 1.8* 1.5* 1.5*   Meds: D5 with Kcl at 75 ml/hr (306 kcals in 24 hours), ss novolog  Height:   Ht Readings from Last 1 Encounters:  04/13/15  (1.6 m)    Weight:   Wt Readings from Last 1 Encounters:  04/18/15 376 lb 15.8 oz (171 kg)   BMI:  Body mass index is 66.8 kg/(m^2).  Estimated Nutritional Needs:   Kcal:  (22-25 kcals/kg IBW) 1144-1300 kcals/d (Using IBW of 52kg)  Protein:  (2.0-2.5 g/IBW) 104-130 g/d  Fluid:  (25-34ml/kg) 1300-1553ml/d  EDUCATION NEEDS:   No education needs identified at this time  HIGH Care Level  Romelle Starcher MS, RD, LDN [phone] Pager

## 2015-04-18 NOTE — Care Management Note (Signed)
Case Management Note  Patient Details  Name: Paige [name] MRN: [id number] Date of Birth: December 03, 1950  Subjective/Objective:   Gastric bypass 8/22 went home 8/26. Started feeling bad, EMS called and patient coded in route in ED.  Coded for approximately 15 mins. Now vented with minimal response noted.  Case complicated by a perforated bowel, requiring surgery. Palliative care consulting. Will follow progress              Action/Plan:   Expected Discharge Date:                  Expected Discharge Plan:     In-House Referral:     Discharge planning Services     Post Acute Care Choice:    Choice offered to:     DME Arranged:    DME Agency:     HH Arranged:    HH Agency:     Status of Service:  In process, will continue to follow  Medicare Important Message Given:    Date Medicare IM Given:    Medicare IM give by:    Date Additional Medicare IM Given:    Additional Medicare Important Message give by:     If discussed at Long Length of Stay Meetings, dates discussed:    Additional Comments:  Marily Memos, RN 04/18/2015, 9:03 AM

## 2015-04-18 NOTE — Progress Notes (Signed)
Patient ID: Paige [name], female   DOB: 1951-06-10, 64 y.o.   MRN: [id number] [facility] PROGRESS NOTE  PCP: [name], MD  HPI/Subjective:   Objective: Filed Vitals:   04/18/15 1300  BP: 121/67  Pulse: 97  Temp: 99.9 F (37.7 C)  Resp: 16    Filed Weights   04/15/15 0500 04/17/15 0500 04/18/15 0645  Weight: 178.3 kg (393 lb 1.3 oz) 171.7 kg (378 lb 8.5 oz) 171 kg (376 lb 15.8 oz)    ROS: Review of Systems  Unable to perform ROS  patient is unresponsive to painful stimuli Exam: Physical Exam  HENT:  Nose: No mucosal edema.  Unable Look in mouth  Eyes: Conjunctivae and lids are normal. Right pupil is not reactive. Left pupil is not reactive.  Neck: No JVD present. Carotid bruit is not present. No edema present. No thyroid mass and no thyromegaly present.  Cardiovascular: S1 normal and S2 normal.  Exam reveals no gallop.   Murmur heard.  Systolic murmur is present with a grade of 2/6  Pulses:      Dorsalis pedis pulses are 2+ on the right side, and 2+ on the left side.  Respiratory: No respiratory distress. She has decreased breath sounds in the right lower field and the left lower field. She has no wheezes. She has no rhonchi. She has no rales.  GI: Soft. Bowel sounds are normal. There is no tenderness.  Musculoskeletal:       Right ankle: She exhibits swelling.       Left ankle: She exhibits swelling.  Lymphadenopathy:    She has no cervical adenopathy.  Neurological:  Unresponsive to painful stimuli. Pupils fixed and dilated. No corneal reflex.  Skin: Skin is warm. No rash noted. Nails show no clubbing.  Psychiatric:  Unable to test secondary to unresponsive to painful stimuli    Data Reviewed: Basic Metabolic Panel:  Recent Labs Lab 04/13/15 0625  04/14/15 0504 04/15/15 0446 04/16/15 0547 04/17/15 0837 04/18/15 0503  NA 145  < > 142 143 145 149*  148* 151*  K 4.7  < > 3.8 3.1* 5.3* 3.1*  3.1* 3.5  CL 108  < > 110 113* 116* 119*   118* 121*  CO2 21*  < > 21* 23 25 23  23 27   GLUCOSE 189*  < > 138* 146* 103* 114*  114* 117*  BUN 78*  < > 78* 65* 59* 36*  36* 26*  CREATININE 4.62*  < > 3.18* 1.94* 1.55* 0.98  1.03* 0.75  CALCIUM 7.9*  < > 7.7* 7.8* 7.3* 7.6*  7.5* 7.8*  MG 2.7*  --   --   --   --   --  1.9  PHOS  --   --   --   --   --  1.7* 1.4*  < > = values in this interval not displayed. Liver Function Tests:  Recent Labs Lab 04/13/15 1130 04/14/15 0504 04/15/15 0446 04/16/15 0547 04/17/15 0837  AST 179* 85* 48* 66*  --   ALT 90* 62* 49 52  --   ALKPHOS 64 53 62 71  --   BILITOT 1.8* 3.2* 2.0* 3.9*  --   PROT 5.6* 5.9* 5.6* 5.2*  --   ALBUMIN 1.8* 2.0* 1.8* 1.5* 1.5*   CBC:  Recent Labs Lab 04/14/15 2321 04/15/15 0446 04/16/15 0547 04/17/15 0930 04/18/15 0503  WBC 17.5* 18.6* 22.2* 11.8* 9.5  HGB 9.5* 9.4* 9.5* 8.6* 8.7*  HCT 29.1* 28.6*  30.7* 27.3* 26.8*  MCV 79.4* 80.0 84.4 81.9 82.5  PLT 95* 95* 69* 64* 84*     Recent Results (from the past 240 hour(s))  MRSA PCR Screening     Status: Abnormal   Collection Time: 04/13/15 11:30 AM  Result Value Ref Range Status   MRSA by PCR POSITIVE (A) NEGATIVE Final    Comment: [facility] CALLED CHRISTINA MILES AT 1740 04/13/15        The GeneXpert MRSA Assay (FDA approved for NASAL specimens only), is one component of a comprehensive MRSA colonization surveillance program. It is not intended to diagnose MRSA infection nor to guide or monitor treatment for MRSA infections.   Culture, blood (routine x 2)     Status: None   Collection Time: 04/13/15 12:45 PM  Result Value Ref Range Status   Specimen Description BLOOD LEFT HAND  Final   Special Requests   Final    BOTTLES DRAWN AEROBIC AND ANAEROBIC  AER 2CC ANA 1CC   Culture  Setup Time   Final    GRAM NEGATIVE RODS AEROBIC BOTTLE ONLY CRITICAL RESULT CALLED TO, READ BACK BY AND VERIFIED WITH: Cicero Duck TAYLOR 04/14/2015 0515 [facility] CONFIRMED BY [facility]    Culture   Final    KLEBSIELLA  PNEUMONIAE IN BOTH AEROBIC AND ANAEROBIC BOTTLES    Report Status 04/17/2015 FINAL  Final   Organism ID, Bacteria KLEBSIELLA PNEUMONIAE  Final      Susceptibility   Klebsiella pneumoniae - MIC*    AMPICILLIN 16 RESISTANT Resistant     CEFTAZIDIME <=1 SENSITIVE Sensitive     CEFAZOLIN <=4 SENSITIVE Sensitive     CEFTRIAXONE <=1 SENSITIVE Sensitive     CIPROFLOXACIN <=0.25 SENSITIVE Sensitive     GENTAMICIN <=1 SENSITIVE Sensitive     IMIPENEM <=0.25 SENSITIVE Sensitive     TRIMETH/SULFA <=20 SENSITIVE Sensitive     PIP/TAZO Value in next row Sensitive      SENSITIVE<=4    * KLEBSIELLA PNEUMONIAE  Culture, blood (routine x 2)     Status: None   Collection Time: 04/13/15  1:05 PM  Result Value Ref Range Status   Specimen Description BLOOD RIGHT HAND  Final   Special Requests BOTTLES DRAWN AEROBIC AND ANAEROBIC  4CC  Final   Culture  Setup Time   Final    GRAM NEGATIVE RODS IN BOTH AEROBIC AND ANAEROBIC BOTTLES CRITICAL RESULT CALLED TO, READ BACK BY AND VERIFIED WITH: Cicero Duck TAYLOR 04/14/2015 0445 [facility]    Culture   Final    KLEBSIELLA PNEUMONIAE IN BOTH AEROBIC AND ANAEROBIC BOTTLES CONFIRMED BY [facility]    Report Status 04/16/2015 FINAL  Final   Organism ID, Bacteria KLEBSIELLA PNEUMONIAE  Final      Susceptibility   Klebsiella pneumoniae - MIC*    AMPICILLIN 16 RESISTANT Resistant     CEFTAZIDIME <=1 SENSITIVE Sensitive     CEFAZOLIN <=4 SENSITIVE Sensitive     CEFTRIAXONE <=1 SENSITIVE Sensitive     CIPROFLOXACIN <=0.25 SENSITIVE Sensitive     GENTAMICIN <=1 SENSITIVE Sensitive     IMIPENEM <=0.25 SENSITIVE Sensitive     TRIMETH/SULFA <=20 SENSITIVE Sensitive     PIP/TAZO Value in next row Sensitive      SENSITIVE<=4    * KLEBSIELLA PNEUMONIAE     Studies: Dg Chest Port 1 View  04/18/2015   CLINICAL DATA:  Status post bariatric surgery 04/07/2015 with subsequent cardiopulmonary rest 04/13/2015. Coagulopathy.  EXAM: PORTABLE CHEST - 1 VIEW  COMPARISON:  Single view  of the  chest 04/15/2014 04/15/2015.  FINDINGS: The patient's NG tube has backed out with the tip now projecting at the level of the carina. Endotracheal tube and surgical drain in the right upper quadrant of the abdomen are unchanged. Heart size is mildly enlarged. There is no pulmonary edema. The lungs appear clear. No pneumothorax.  IMPRESSION: NG tube has backed out with the tip now projecting at the level of the carina.  Cardiomegaly without edema.   Electronically Signed   By: Drusilla Kanner M.D.   On: 04/18/2015 07:53   Dg Abd Portable 1v  04/18/2015   CLINICAL DATA:  Orogastric tube placement, previous gastric bypass procedure  EXAM: PORTABLE ABDOMEN - 1 VIEW  COMPARISON:  04/13/2015  FINDINGS: Exam detail obscured by body habitus. Multiple catheters overlie the abdomen. Tip of presumed orogastric tube terminates at least over the distal esophagus and may continue to terminate over the body of the stomach but the distal portion is poorly visualized. No dilated loop of bowel.  IMPRESSION: Orogastric tube tip terminates at least over the distal esophagus and could be located over the expected location of the body of the stomach but is not well visualized.   Electronically Signed   By: Christiana Pellant M.D.   On: 04/18/2015 11:39    Scheduled Meds: . amantadine  100 mg Oral BID  . antiseptic oral rinse  7 mL Mouth Rinse QID  . chlorhexidine gluconate  15 mL Mouth Rinse BID  . insulin aspart  0-15 Units Subcutaneous 6 times per day  . lacosamide (VIMPAT) IV  50 mg Intravenous Q12H  . meropenem (MERREM) IV  1 g Intravenous Q8H  . pantoprazole (PROTONIX) IV  40 mg Intravenous Q12H  . sodium chloride  3 mL Intravenous Q12H  . thiamine IV  100 mg Intravenous Daily   Continuous Infusions: . Marland KitchenTPN (CLINIMIX-E) Adult    . dextrose 5 % with KCl 20 mEq / L    . norepinephrine Stopped (04/17/15 0600)    Assessment/Plan:  1. Shock- likely combination of cardiogenic, hemorrhagic and septic. Patient is now  off Levophed. 2. Cardiac arrest- likely due to dehiscence of the anastomosis with leaking of bowel contents into the abdomen. Continue supportive care. 3. Acute encephalopathy- patient is unresponsive to painful stimuli. Neurology once to give the patient more time. Overall prognosis is going to be linked to the patient's mental status. This could be anoxic encephalopathy versus toxic encephalopathy. EEG- showed worsening signs but Neurology wants to give more time 4. Acute respiratory failure- full ventilator support is going to be needed until mental status improves. 5. Gastrointestinal bleed- continue to monitor closely, last hemoglobin 8.7 6. Sepsis with Klebsiella- continue IV meropenem. White count trending a little bit better. Likely secondary to dehiscence of the anastomosis. . Patient is status surgery for correction of this. 7. Acute renal failure- creatinine has now normalized 8. Coagulopathy- INR trending better. Likely secondary to sepsis. 9. Complication of recent gastric bypass surgery with dehiscence of anastomosis site requiring operative repair. Spoke with Surgery- may need another repair.  I will call Dr Smitty Cords to set up transfer to [facility].  Code Status:     Code Status Orders        Start     Ordered   04/14/15 1745  Limited resuscitation (code)   Continuous    Comments:  May administer /continue pressors and antiarythmics --but not in the event of asystole or PEA.  (Ok to treat tachycardia or bradycardia  or AFib/flutter).  Question Answer Comment  In the event of cardiac or respiratory ARREST: Initiate Code Blue, Call Rapid Response No   In the event of cardiac or respiratory ARREST: Perform CPR No   In the event of cardiac or respiratory ARREST: Perform Intubation/Mechanical Ventilation Yes   In the event of cardiac or respiratory ARREST: Use NIPPV/BiPAp only if indicated Yes   In the event of cardiac or respiratory ARREST: Administer ACLS medications if indicated No   In  the event of cardiac or respiratory ARREST: Perform Defibrillation or Cardioversion if indicated No      04/14/15 1745     Family Communication: Daughter was at the bedside while I was seeing the patient  Disposition Plan: Possible transfer to [facility]  Time spent: 35 minutes critical care time  Alford Highland  [facility]

## 2015-04-18 NOTE — Progress Notes (Signed)
Report called to [facility] to [employer]. Report also given to [facility] transport. Family aware of transfer.

## 2015-04-18 NOTE — Progress Notes (Signed)
Palliative Medicine Inpatient Consult Follow Up Note   Name: Paige [name] Date: 04/18/2015 MRN: [id number]  DOB: May 08, 1951  Referring Physician: Alford Highland, MD  Palliative Care consult requested for this 64 y.o. female for goals of medical therapy in patient with cardiac arrest following bariatric surgery.  She has been comatose and has had repeat surgery to try to correct the sepsis and metabolic factors that could have affected her cognitive presentation.  At this time, we are all waiting to see what neurologic recovery there may be by early next week.  Neurologist, Dr. Katrinka Blazing, is managing determinations about her brain health day by day --but she is to be evaluated daily at least into early next week, in the event that some of her neurologic function might return.    TODAY'S CONVERSATIONS, EVENTS, AND PLANS: 1.  Pt continues to be Limited Code.  2.  I was asked by nursing to approach family and talk with since they have 'been so sad'.  I mentioned that I would do so, but I have not intentions of bringing up any issues related to her brain health or injury since the plan is to wait until Dr. Katrinka Blazing reassesses her next week. I spoke with Dr. Katrinka Blazing before talking with family.  He has not yet seen the latest EEG results but will soon.   I spoke with daughter and let her know I was available.  She had no needs to be addressed that she could think of. She is 'just waiting on hearing the latest EEG results'.  3.  I will keep up with pts condition daily but will follow at a distance with occasional visits in the event that there is need for further palliative -or even terminal care related issues. But I will not be talking daily with family as there is no need for that. The sister seemed to appreciate being able to tell me a few important bits of info about the pt.      REVIEW OF SYSTEMS:  Patient is not able to provide ROS due to coma  CODE STATUS: Limited code   PAST MEDICAL HISTORY: Past  Medical History  Diagnosis Date  . Blood clot in vein 01/2012  . Arthritis   . Heart disease   . Hemorrhoids   . Hypertension   . GERD (gastroesophageal reflux disease)   . Peptic ulcer disease     PAST SURGICAL HISTORY:  Past Surgical History  Procedure Laterality Date  . Esophagogastroduodenoscopy N/A 01/08/2015    Procedure: ESOPHAGOGASTRODUODENOSCOPY (EGD);  Surgeon: Scot Jun, MD;  [facility] ENDOSCOPY;  Service: Endoscopy;  Laterality: N/A;  . Laparoscopy      Bariatric Sx 8/22  . Other surgical history      IVC Filter    Vital Signs: BP 119/84 mmHg  Pulse 102  Temp(Src) 100 F (37.8 C) (Core (Comment))  Resp 18  Ht  (1.6 m)  Wt 171 kg (376 lb 15.8 oz)  BMI 66.80 kg/m2  SpO2 99% Filed Weights   04/15/15 0500 04/17/15 0500 04/18/15 0645  Weight: 178.3 kg (393 lb 1.3 oz) 171.7 kg (378 lb 8.5 oz) 171 kg (376 lb 15.8 oz)    Estimated body mass index is 66.8 kg/(m^2) as calculated from the following:   Height as of this encounter:  (1.6 m).   Weight as of this encounter: 171 kg (376 lb 15.8 oz).  PHYSICAL EXAM: Morbidly obese, intubated adn unresponsive. Pupils are equal but do not withdraw  to threats She reportedly has a gag reflex No JVD Hrt irreg irreg with mild tachycardia, no mgr Lungs vented breath sounds, no breathing over vent Abd obese and with surgical wounds dressed Ext 1plus lower edema Skin w/o mottling or cyanosis   LABS: CBC:    Component Value Date/Time   WBC 9.5 04/18/2015 0503   WBC 5.8 08/02/2014 1312   HGB 8.7* 04/18/2015 0503   HGB 11.5* 08/02/2014 1312   HCT 26.8* 04/18/2015 0503   HCT 36.5 08/02/2014 1312   PLT 84* 04/18/2015 0503   PLT 216 08/30/2014 1025   MCV 82.5 04/18/2015 0503   MCV 83 08/02/2014 1312   NEUTROABS 2.9 12/30/2014 1706   NEUTROABS 3.9 08/02/2014 1312   LYMPHSABS 1.4 12/30/2014 1706   LYMPHSABS 1.3 08/02/2014 1312   MONOABS 0.3 12/30/2014 1706   MONOABS 0.3 08/02/2014 1312   EOSABS  0.2 12/30/2014 1706   EOSABS 0.2 08/02/2014 1312   BASOSABS 0.0 12/30/2014 1706   BASOSABS 0.0 08/02/2014 1312   Comprehensive Metabolic Panel:    Component Value Date/Time   NA 151* 04/18/2015 0503   NA 139 08/31/2011 0733   K 3.5 04/18/2015 0503   K 3.8 08/31/2011 0733   CL 121* 04/18/2015 0503   CL 107 08/31/2011 0733   CO2 27 04/18/2015 0503   CO2 25 08/31/2011 0733   BUN 26* 04/18/2015 0503   BUN 10 08/31/2011 0733   CREATININE 0.75 04/18/2015 0503   CREATININE 0.74 08/02/2014 1312   GLUCOSE 117* 04/18/2015 0503   GLUCOSE 77 08/31/2011 0733   CALCIUM 7.8* 04/18/2015 0503   CALCIUM 9.0 08/31/2011 0733   AST 66* 04/16/2015 0547   ALT 52 04/16/2015 0547   ALKPHOS 71 04/16/2015 0547   BILITOT 3.9* 04/16/2015 0547   PROT 5.2* 04/16/2015 0547   ALBUMIN 1.5* 04/17/2015 0837    IMPRESSION: 1. 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 ---Another EEG from the am of 04/18/15 is pending as per neurologist.  2. 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 Smitty Cords on 04/15/15.   3. Shock ---due to sepsis and also cardiogenic ---bacteremia noted (GNR in bl cxs ) ---continues on pressors (levophed and vasopressin) 4. S/P Cardiac arrest 5. Acute respiratory failure --on full vent support 6. Coagulopathy 7. Acute systolic CHF  ---EF =35%  with no known prior heart history 8. Acute Renal Failure 9. Elevated bilirubin 10. Hypoalbuminemia 11. Anemia of unclear etiology 12. Leukocytosis 13. Metabolic acidosis 14. Cardiomegaly and pulmonary edema    More than 50% of the visit was spent in counseling/coordination of care: YES  Time Spent:  25 min

## 2015-04-18 NOTE — Progress Notes (Signed)
SUBJECTIVE: intubated and sedated  Filed Vitals:   04/18/15 0600 04/18/15 0645 04/18/15 0700 04/18/15 0800  BP: 116/72  126/84 112/73  Pulse:   100   Temp: 99.1 F (37.3 C)  99.3 F (37.4 C) 99.5 F (37.5 C)  TempSrc:      Resp: Height:      Weight:  171 kg (376 lb 15.8 oz)    SpO2:   100%     Intake/Output Summary (Last 24 hours) at 04/18/15 0852 Last data filed at 04/18/15 0700  Gross per 24 hour  Intake    743 ml  Output   2975 ml  Net  -2232 ml    LABS: Basic Metabolic Panel:  Recent Labs  29/56/21 0837 04/18/15 0503  NA 149*  148* 151*  K 3.1*  3.1* 3.5  CL 119*  118* 121*  CO2 GLUCOSE 114*  114* 117*  BUN 36*  36* 26*  CREATININE 0.98  1.03* 0.75  CALCIUM 7.6*  7.5* 7.8*  MG  --  1.9  PHOS 1.7* 1.4*   Liver Function Tests:  Recent Labs  04/16/15 0547 04/17/15 0837  AST 66*  --   ALT 52  --   ALKPHOS 71  --   BILITOT 3.9*  --   PROT 5.2*  --   ALBUMIN 1.5* 1.5*   No results for input(s): LIPASE, AMYLASE in the last 72 hours. CBC:  Recent Labs  04/17/15 0930 04/18/15 0503  WBC 11.8* 9.5  HGB 8.6* 8.7*  HCT 27.3* 26.8*  MCV 81.9 82.5  PLT 64* 84*   Cardiac Enzymes: No results for input(s): CKTOTAL, CKMB, CKMBINDEX, TROPONINI in the last 72 hours. BNP: Invalid input(s): POCBNP D-Dimer: No results for input(s): DDIMER in the last 72 hours. Hemoglobin A1C: No results for input(s): HGBA1C in the last 72 hours. Fasting Lipid Panel: No results for input(s): CHOL, HDL, LDLCALC, TRIG, CHOLHDL, LDLDIRECT in the last 72 hours. Thyroid Function Tests: No results for input(s): TSH, T4TOTAL, T3FREE, THYROIDAB in the last 72 hours.  Invalid input(s): FREET3 Anemia Panel: No results for input(s): VITAMINB12, FOLATE, FERRITIN, TIBC, IRON, RETICCTPCT in the last 72 hours.   PHYSICAL EXAM General: obese, critically ill appearing HEENT: Normocephalic and atramatic Neck: No JVD.  Lungs: rhonchi b/l Heart:  irregularly irregular  TELEMETRY: Reviewed telemetry pt in a-fib VR in 110s  ASSESSMENT AND PLAN:  pt s/p cardiac arrest, troponin negative x3. 2/2 septic shock.  CHF: Echo shows decreased EF (35%). Consider Bb if prognosis improves.   Atrial fibrillation: Amiodarone gtt never started 2/2 hypotension, will d/c order. VR now in 110s, add single dose of digoxin.   Principal Problem:   Cardiac arrest Active Problems:   Acute renal failure   Acute respiratory failure   Acute GI bleeding   Shock   Severe sepsis    KHAN,SHAUKAT A, MD, [facility] 04/18/2015 8:52 AM

## 2015-04-18 NOTE — Discharge Summary (Signed)
[facility] [place] at [facility]   PATIENT NAME: Paige [name]    MR#:  [id number]  DATE OF BIRTH:  [birth year]  DATE OF ADMISSION:  04/13/2015 ADMITTING PHYSICIAN: Gale Journey, MD  DATE OF DISCHARGE: 04/18/2015  PRIMARY CARE PHYSICIAN: MASOUD,JAVED, MD    ADMISSION DIAGNOSIS:  Cardiac arrest [I46.9] GI bleed [K92.2] Gastrointestinal hemorrhage associated with acute gastritis [K29.01] Acute renal failure, unspecified acute renal failure type [N17.9]  DISCHARGE DIAGNOSIS:  Principal Problem:   Cardiac arrest Active Problems:   Acute renal failure   Acute respiratory failure   Acute GI bleeding   Shock   Severe sepsis   SECONDARY DIAGNOSIS:   Past Medical History  Diagnosis Date  . Blood clot in vein 01/2012  . Arthritis   . Heart disease   . Hemorrhoids   . Hypertension   . GERD (gastroesophageal reflux disease)   . Peptic ulcer disease     HOSPITAL COURSE:   1. Shock likely combination of cardiogenic, hemorrhagic and septic shock. Patient is now off Levophed drip. 2. Cardiac arrest likely due to deep dehiscence of the anastomosis with leaking of bowel contents into the abdomen. Continue supportive care. 3. Acute encephalopathy- patient is still unresponsive to painful stimuli. Second EEG showed worsening signs. Neurology wants to give the patient little more time and advise being transferred over to [facility] for surgical management. Patient is not on any sedation at this time. 4. Acute respiratory failure- full ventilator support is needed until mental status improves. 5. Gastrointestinal bleed- last hemoglobin 8.7. Patient is on Protonix. 6. Sepsis with Klebsiella and leukocytosis patient is on IV meropenem. Likely this is secondary to dehiscence of anastomosis. 7. Acute renal failure- creatinine has now normalized. 8. Coagulopathy -INR trending better, likely secondary to sepsis. 9. Consultation a recent gastric bypass surgery with  dehiscence of anastomosis requiring another operative repair here at [facility]. Our surgeon here, Dr. Juliann Pulse recommended transfer to [facility] under the care of Dr. Smitty Cords. Dr. Juliann Pulse spoke with Dr. Smitty Cords today to try to set up transfer. I tried to reach Dr. Smitty Cords but was unable to do so. I called the transfer center at [facility] and they did not hear of this patient being transferred. Hopefully we can get in touch today to set up transfer. 10. Nutrition- TPN ordered.  DISCHARGE CONDITIONS:   Guarded  CONSULTS OBTAINED:  Treatment Team:  Laurier Nancy, MD Elnita Maxwell, MD Merwyn Katos, MD Mellody Drown, MD  DRUG ALLERGIES:   Allergies  Allergen Reactions  . Lisinopril Swelling    This medication makes patient lips swell.    DISCHARGE MEDICATIONS:   Current Discharge Medication List    START taking these medications   Details  acetaminophen (TYLENOL) 650 MG suppository Place 1 suppository (650 mg total) rectally every 4 (four) hours as needed for fever. Qty: 12 suppository, Refills: 0    amantadine (SYMMETREL) 50 MG/5ML solution Take 10 mLs (100 mg total) by mouth 2 (two) times daily. Qty: 140 mL, Refills: 0    chlorhexidine gluconate (PERIDEX) 0.12 % solution 15 mLs by Mouth Rinse route 2 (two) times daily. Qty: 120 mL, Refills: 0    insulin aspart (NOVOLOG) 100 UNIT/ML injection Inject 0-15 Units into the skin every 4 (four) hours. Qty: 10 mL, Refills: 11    lacosamide 50 mg in sodium chloride 0.9 % 25 mL Inject 50 mg into the vein every 12 (twelve) hours.    meropenem 1 g  in sodium chloride 0.9 % 100 mL Inject 1 g into the vein every 8 (eight) hours.    pantoprazole (PROTONIX) 40 MG injection Inject 40 mg into the vein every 12 (twelve) hours. Qty: 1 each    thiamine (B-1) 100 MG/ML injection Inject 1 mL (100 mg total) into the vein daily. Qty: 25 mL      CONTINUE these medications which have CHANGED   Details  ondansetron (ZOFRAN ODT) 4 MG  disintegrating tablet Take 1 tablet (4 mg total) by mouth every 4 (four) hours as needed. For up to 7 days Qty: 20 tablet, Refills: 0      STOP taking these medications     amLODipine (NORVASC) 5 MG tablet      aspirin 81 MG tablet      carvedilol (COREG) 25 MG tablet      cholecalciferol (VITAMIN D) 400 UNITS TABS tablet      hydrALAZINE (APRESOLINE) 50 MG tablet      HYDROcodone-acetaminophen (HYCET) 7.5-325 mg/15 ml solution      omeprazole (PRILOSEC) 40 MG capsule      warfarin (COUMADIN) 3 MG tablet      enoxaparin (LOVENOX) 120 MG/0.8ML injection          DISCHARGE INSTRUCTIONS:   Follow-up at [facility] Dr. Smitty Cords.  If you experience worsening of your admission symptoms, develop shortness of breath, life threatening emergency, suicidal or homicidal thoughts you must seek medical attention immediately by calling 911 or calling your MD immediately  if symptoms less severe.  You Must read complete instructions/literature along with all the possible adverse reactions/side effects for all the Medicines you take and that have been prescribed to you. Take any new Medicines after you have completely understood and accept all the possible adverse reactions/side effects.   Please note  You were cared for by a hospitalist during your hospital stay. If you have any questions about your discharge medications or the care you received while you were in the hospital after you are discharged, you can call the unit and asked to speak with the hospitalist on call if the hospitalist that took care of you is not available. Once you are discharged, your primary care physician will handle any further medical issues. Please note that NO REFILLS for any discharge medications will be authorized once you are discharged, as it is imperative that you return to your primary care physician (or establish a relationship with a primary care physician if you do not have one) for your aftercare needs so  that they can reassess your need for medications and monitor your lab values.    Today   CHIEF COMPLAINT:  No chief complaint on file.   HISTORY OF PRESENT ILLNESS:  Paige [name]  is a 64 y.o. female with a known history of recent gastric bypass surgery. Patient presented to the hospital and cardiac arrest.   VITAL SIGNS:  Blood pressure 128/84, pulse 102, temperature 99.9 F (37.7 C), temperature source Core (Comment), resp. rate 19, height 5\' 3"  (1.6 m), weight 171 kg (376 lb 15.8 oz), SpO2 99 %.   DATA REVIEW:   CBC  Recent Labs Lab 04/18/15 0503  WBC 9.5  HGB 8.7*  HCT 26.8*  PLT 84*    Chemistries   Recent Labs Lab 04/16/15 0547  04/18/15 0503  NA 145  < > 151*  K 5.3*  < > 3.5  CL 116*  < > 121*  CO2 25  < > 27  GLUCOSE 103*  < > 117*  BUN 59*  < > 26*  CREATININE 1.55*  < > 0.75  CALCIUM 7.3*  < > 7.8*  MG  --   --  1.9  AST 66*  --   --   ALT 52  --   --   ALKPHOS 71  --   --   BILITOT 3.9*  --   --   < > = values in this interval not displayed.  Cardiac Enzymes  Recent Labs Lab 04/14/15 0504  TROPONINI 0.03    Microbiology Results  Results for orders placed or performed during the hospital encounter of 04/13/15  MRSA PCR Screening     Status: Abnormal   Collection Time: 04/13/15 11:30 AM  Result Value Ref Range Status   MRSA by PCR POSITIVE (A) NEGATIVE Final    Comment: [facility] CALLED CHRISTINA MILES AT 1740 04/13/15        The GeneXpert MRSA Assay (FDA approved for NASAL specimens only), is one component of a comprehensive MRSA colonization surveillance program. It is not intended to diagnose MRSA infection nor to guide or monitor treatment for MRSA infections.   Culture, blood (routine x 2)     Status: None   Collection Time: 04/13/15 12:45 PM  Result Value Ref Range Status   Specimen Description BLOOD LEFT HAND  Final   Special Requests   Final    BOTTLES DRAWN AEROBIC AND ANAEROBIC  AER 2CC ANA 1CC   Culture  Setup Time    Final    GRAM NEGATIVE RODS AEROBIC BOTTLE ONLY CRITICAL RESULT CALLED TO, READ BACK BY AND VERIFIED WITH: Cicero Duck TAYLOR 04/14/2015 0515 [facility] CONFIRMED BY [facility]    Culture   Final    KLEBSIELLA PNEUMONIAE IN BOTH AEROBIC AND ANAEROBIC BOTTLES    Report Status 04/17/2015 FINAL  Final   Organism ID, Bacteria KLEBSIELLA PNEUMONIAE  Final      Susceptibility   Klebsiella pneumoniae - MIC*    AMPICILLIN 16 RESISTANT Resistant     CEFTAZIDIME <=1 SENSITIVE Sensitive     CEFAZOLIN <=4 SENSITIVE Sensitive     CEFTRIAXONE <=1 SENSITIVE Sensitive     CIPROFLOXACIN <=0.25 SENSITIVE Sensitive     GENTAMICIN <=1 SENSITIVE Sensitive     IMIPENEM <=0.25 SENSITIVE Sensitive     TRIMETH/SULFA <=20 SENSITIVE Sensitive     PIP/TAZO Value in next row Sensitive      SENSITIVE<=4    * KLEBSIELLA PNEUMONIAE  Culture, blood (routine x 2)     Status: None   Collection Time: 04/13/15  1:05 PM  Result Value Ref Range Status   Specimen Description BLOOD RIGHT HAND  Final   Special Requests BOTTLES DRAWN AEROBIC AND ANAEROBIC  4CC  Final   Culture  Setup Time   Final    GRAM NEGATIVE RODS IN BOTH AEROBIC AND ANAEROBIC BOTTLES CRITICAL RESULT CALLED TO, READ BACK BY AND VERIFIED WITH: Cicero Duck TAYLOR 04/14/2015 0445 [facility]    Culture   Final    KLEBSIELLA PNEUMONIAE IN BOTH AEROBIC AND ANAEROBIC BOTTLES CONFIRMED BY [facility]    Report Status 04/16/2015 FINAL  Final   Organism ID, Bacteria KLEBSIELLA PNEUMONIAE  Final      Susceptibility   Klebsiella pneumoniae - MIC*    AMPICILLIN 16 RESISTANT Resistant     CEFTAZIDIME <=1 SENSITIVE Sensitive     CEFAZOLIN <=4 SENSITIVE Sensitive     CEFTRIAXONE <=1 SENSITIVE Sensitive     CIPROFLOXACIN <=0.25 SENSITIVE Sensitive  GENTAMICIN <=1 SENSITIVE Sensitive     IMIPENEM <=0.25 SENSITIVE Sensitive     TRIMETH/SULFA <=20 SENSITIVE Sensitive     PIP/TAZO Value in next row Sensitive      SENSITIVE<=4    * KLEBSIELLA PNEUMONIAE    RADIOLOGY:  Dg Chest Port 1  View  04/18/2015   CLINICAL DATA:  Status post bariatric surgery 04/07/2015 with subsequent cardiopulmonary rest 04/13/2015. Coagulopathy.  EXAM: PORTABLE CHEST - 1 VIEW  COMPARISON:  Single view of the chest 04/15/2014 04/15/2015.  FINDINGS: The patient's NG tube has backed out with the tip now projecting at the level of the carina. Endotracheal tube and surgical drain in the right upper quadrant of the abdomen are unchanged. Heart size is mildly enlarged. There is no pulmonary edema. The lungs appear clear. No pneumothorax.  IMPRESSION: NG tube has backed out with the tip now projecting at the level of the carina.  Cardiomegaly without edema.   Electronically Signed   By: Drusilla Kanner M.D.   On: 04/18/2015 07:53   Dg Abd Portable 1v  04/18/2015   CLINICAL DATA:  Orogastric tube placement, previous gastric bypass procedure  EXAM: PORTABLE ABDOMEN - 1 VIEW  COMPARISON:  04/13/2015  FINDINGS: Exam detail obscured by body habitus. Multiple catheters overlie the abdomen. Tip of presumed orogastric tube terminates at least over the distal esophagus and may continue to terminate over the body of the stomach but the distal portion is poorly visualized. No dilated loop of bowel.  IMPRESSION: Orogastric tube tip terminates at least over the distal esophagus and could be located over the expected location of the body of the stomach but is not well visualized.   Electronically Signed   By: Christiana Pellant M.D.   On: 04/18/2015 11:39    Management plans discussed with the patient's daughter and she is in agreement.  CODE STATUS:     Code Status Orders        Start     Ordered   04/14/15 1745  Limited resuscitation (code)   Continuous    Comments:  May administer /continue pressors and antiarythmics --but not in the event of asystole or PEA.  (Ok to treat tachycardia or bradycardia or AFib/flutter).  Question Answer Comment  In the event of cardiac or respiratory ARREST: Initiate Code Blue, Call Rapid  Response No   In the event of cardiac or respiratory ARREST: Perform CPR No   In the event of cardiac or respiratory ARREST: Perform Intubation/Mechanical Ventilation Yes   In the event of cardiac or respiratory ARREST: Use NIPPV/BiPAp only if indicated Yes   In the event of cardiac or respiratory ARREST: Administer ACLS medications if indicated No   In the event of cardiac or respiratory ARREST: Perform Defibrillation or Cardioversion if indicated No      04/14/15 1745      TOTAL TIME TAKING CARE OF THIS PATIENT: 50 minutes.    Alford Highland M.D on 04/18/2015 at 2:15 PM  Between 7am to 6pm - Pager - [phone]  After 6pm go to [email or web address] - password [email or web address] [facility]  [facility]  Office  [phone]  CC: Primary care physician; [name], MD Geoffry Paradise

## 2015-04-18 NOTE — Progress Notes (Signed)
Called [facility] to check in since patient had GCS of 5. Patient had been referred before but not indicated in chart if patient had been ruled out. [facility] is following patient and needs to be called back for brain death testing or withdrawal of support. RN wrote this in flowsheets and sticky notes to alert team.

## 2015-04-18 NOTE — Progress Notes (Signed)
Pharmacy Consult for Meropenem/TPN Management Indication: Klebsiella bacteremia/peritonitis with intra-abdominal abscesses   Allergies  Allergen Reactions  . Lisinopril Swelling    This medication makes patient lips swell.    Patient Measurements: Height: 5\' 3"  (160 cm) Weight: (!) 376 lb 15.8 oz (171 kg) IBW/kg (Calculated) : 52.4   Vital Signs: Temp: 99.9 F (37.7 C) (09/02 1400) BP: 128/84 mmHg (09/02 1400) Pulse Rate: 102 (09/02 1400) Intake/Output from previous day: 09/01 0701 - 09/02 0700 In: 793 [I.V.:50; NG/GT:173; IV Piggyback:530] Out: 2975 [Urine:2125; Drains:850] Intake/Output from this shift: Total I/O In: 155 [Other:25; IV Piggyback:130] Out: 685 [Urine:455; Drains:230]  Labs:  Recent Labs  04/16/15 0547 04/17/15 0837 04/17/15 0930 04/18/15 0503  WBC 22.2*  --  11.8* 9.5  HGB 9.5*  --  8.6* 8.7*  PLT 69*  --  64* 84*  CREATININE 1.55* 0.98  1.03*  --  0.75   Estimated Creatinine Clearance: 111.9 mL/min (by C-G formula based on Cr of 0.75). No results for input(s): VANCOTROUGH, VANCOPEAK, VANCORANDOM, GENTTROUGH, GENTPEAK, GENTRANDOM, TOBRATROUGH, TOBRAPEAK, TOBRARND, AMIKACINPEAK, AMIKACINTROU, AMIKACIN in the last 72 hours.   Microbiology: Recent Results (from the past 720 hour(s))  MRSA PCR Screening     Status: Abnormal   Collection Time: 04/13/15 11:30 AM  Result Value Ref Range Status   MRSA by PCR POSITIVE (A) NEGATIVE Final    Comment: [facility] CALLED CHRISTINA MILES AT 1740 04/13/15        The GeneXpert MRSA Assay (FDA approved for NASAL specimens only), is one component of a comprehensive MRSA colonization surveillance program. It is not intended to diagnose MRSA infection nor to guide or monitor treatment for MRSA infections.   Culture, blood (routine x 2)     Status: None   Collection Time: 04/13/15 12:45 PM  Result Value Ref Range Status   Specimen Description BLOOD LEFT HAND  Final   Special Requests   Final    BOTTLES DRAWN  AEROBIC AND ANAEROBIC  AER 2CC ANA 1CC   Culture  Setup Time   Final    GRAM NEGATIVE RODS AEROBIC BOTTLE ONLY CRITICAL RESULT CALLED TO, READ BACK BY AND VERIFIED WITH: Cicero Duck TAYLOR 04/14/2015 0515 [facility] CONFIRMED BY [facility]    Culture   Final    KLEBSIELLA PNEUMONIAE IN BOTH AEROBIC AND ANAEROBIC BOTTLES    Report Status 04/17/2015 FINAL  Final   Organism ID, Bacteria KLEBSIELLA PNEUMONIAE  Final      Susceptibility   Klebsiella pneumoniae - MIC*    AMPICILLIN 16 RESISTANT Resistant     CEFTAZIDIME <=1 SENSITIVE Sensitive     CEFAZOLIN <=4 SENSITIVE Sensitive     CEFTRIAXONE <=1 SENSITIVE Sensitive     CIPROFLOXACIN <=0.25 SENSITIVE Sensitive     GENTAMICIN <=1 SENSITIVE Sensitive     IMIPENEM <=0.25 SENSITIVE Sensitive     TRIMETH/SULFA <=20 SENSITIVE Sensitive     PIP/TAZO Value in next row Sensitive      SENSITIVE<=4    * KLEBSIELLA PNEUMONIAE  Culture, blood (routine x 2)     Status: None   Collection Time: 04/13/15  1:05 PM  Result Value Ref Range Status   Specimen Description BLOOD RIGHT HAND  Final   Special Requests BOTTLES DRAWN AEROBIC AND ANAEROBIC  4CC  Final   Culture  Setup Time   Final    GRAM NEGATIVE RODS IN BOTH AEROBIC AND ANAEROBIC BOTTLES CRITICAL RESULT CALLED TO, READ BACK BY AND VERIFIED WITH: Cicero Duck TAYLOR 04/14/2015 0445 [facility]  Culture   Final    KLEBSIELLA PNEUMONIAE IN BOTH AEROBIC AND ANAEROBIC BOTTLES CONFIRMED BY [facility]    Report Status 04/16/2015 FINAL  Final   Organism ID, Bacteria KLEBSIELLA PNEUMONIAE  Final      Susceptibility   Klebsiella pneumoniae - MIC*    AMPICILLIN 16 RESISTANT Resistant     CEFTAZIDIME <=1 SENSITIVE Sensitive     CEFAZOLIN <=4 SENSITIVE Sensitive     CEFTRIAXONE <=1 SENSITIVE Sensitive     CIPROFLOXACIN <=0.25 SENSITIVE Sensitive     GENTAMICIN <=1 SENSITIVE Sensitive     IMIPENEM <=0.25 SENSITIVE Sensitive     TRIMETH/SULFA <=20 SENSITIVE Sensitive     PIP/TAZO Value in next row Sensitive      SENSITIVE<=4     * KLEBSIELLA PNEUMONIAE    Medical History: Past Medical History  Diagnosis Date  . Blood clot in vein 01/2012  . Arthritis   . Heart disease   . Hemorrhoids   . Hypertension   . GERD (gastroesophageal reflux disease)   . Peptic ulcer disease     Medications:  Scheduled:  . amantadine  100 mg Oral BID  . antiseptic oral rinse  7 mL Mouth Rinse QID  . chlorhexidine gluconate  15 mL Mouth Rinse BID  . insulin aspart  0-15 Units Subcutaneous 6 times per day  . lacosamide (VIMPAT) IV  50 mg Intravenous Q12H  . magnesium sulfate 1 - 4 g bolus IVPB  2 g Intravenous Once  . meropenem (MERREM) IV  1 g Intravenous Q8H  . pantoprazole (PROTONIX) IV  40 mg Intravenous Q12H  . potassium phosphate IVPB (mmol)  30 mmol Intravenous Once  . sodium chloride  3 mL Intravenous Q12H  . thiamine IV  100 mg Intravenous Daily   Infusions:  . Marland KitchenTPN (CLINIMIX-E) Adult    . dextrose 5 % with KCl 20 mEq / L 20 mEq (04/18/15 1354)  . norepinephrine Stopped (04/17/15 0600)   PRN: acetaminophen  Assessment: 64 y/o F with s/p gastric bypass surgery with peritonitis and intraabdominal abscesses. Blood cultures growing Klebsiella. Renal function is improving. Patient initiated on Clinimix E 5/15 @ 45mL/hr on 9/2. Patient is currently receiving D5 with of Potassium at 70mL/hr.   Plan:   1. Meropenem: Will continue patient on meropenem 1g IV Q8hr.    2.TPN: Will supplement magnesium 2g IV x 1 and potassium phosphorus IV x 1. Will recheck potassium and phosphorus at 2200. Depending on potassium level, will consider removing potassium from IV fluids.    Will all recheck electrolytes with am labs.   Will closely monitor blood glucose and initiate insulin drip as appropriate.   Pharmacy will continue to monitor and adjust per consult.     Simpson,Michael L 04/18/2015,2:42 PM

## 2015-04-18 NOTE — Progress Notes (Signed)
Spoke with Dr. Juliann Pulse on the phone about NG tube being partially removed (25 cm at the nare). MD wants tube replaced and hooked up to LIS, MD ordered OG tube since patient intubated.

## 2015-04-18 NOTE — Progress Notes (Signed)
Surgery Progress Note  S: Continues to drain from LUQ JP (330 overnight), now more significant and less bloody and bilious.  Now approx 60 cc overnight of same from LLQ JP.  Tube feeds at 10. (drainage does not contain tube feeds) O:Blood pressure 116/72, pulse 95, temperature 99.1 F (37.3 C), temperature source Core (Comment), resp. rate 19, height  (1.6 m), weight 376 lb 15.8 oz (171 kg), SpO2 100 %. GEN: NAD, unresponsive to sternal rub ABD: soft, nondistended, RUQ and RLQ drains with serosang drainage, incision c/d/i  Labs:  WBC 9.5 (improved), Hgb 8.7 (stable)  A/P 64 yo admitted post cardiac arrest, s/p ex lap, washout, primary closure of anastamotic leak - I have spoken with Dr. Smitty Cords regarding new findings.  He has told me of his plan to transfer patient to [facility] where he can take care of her surgical issues.

## 2015-04-18 NOTE — Progress Notes (Signed)
Patient has left with [facility] Med transport. Family notified and given number of new unit at [facility]. RN also updated [employer] of transfer.

## 2015-04-18 NOTE — Progress Notes (Signed)
NEUROLOGY NOTE  S: per chart, Surgeon wants to transfer to [facility] to handle surgical complications  ROS unobtainable secondary to mental status  O: 99.9 121/67 97 16 Obese, moderate distress Normocephalic, oropharynx clear Supple, nl JVD CTA B but there is some wheezing from trachia RRR, no murmur No C/C/E  Intubated, not sedated, opens slightly to pain but does not track or follow, GCS 5T PERRLA, + corneals B, down gaze, good cough Still weak flexion B to pain  Repeat EEG shows some burst suppression and more slowing than previously  A/P: 1. Encephalopathy- worsened on EEG but clinically not changed much;  The pattern of slowing can be seen with severe encephalopathy from anoxia with suppression or even other toxic/metabolic causes as well.   - start thiamine  IV daily -  Neupro patch  daily -  Agree with TPN  - Long discussion with daughter about unclear prognosis neurologically and recommended that she at least try surgery and if that is successful then to give her about one week after that to see if she neurologically improves - Will sign off, please call with questions -  Will get another prognosis from Neurology at [facility]

## 2015-04-18 NOTE — Progress Notes (Signed)
Patient preparing to leave facility with [facility]. RN will call [facility] and daughter to update on upcoming arrival.

## 2015-04-18 NOTE — Progress Notes (Addendum)
PULMONARY / CRITICAL CARE MEDICINE   Name: Paige [name] MRN: [id number] DOB: 03-07-1951    ADMISSION DATE:  2015-04-26  PT PROFILE:  71 F underwent bariatric surgery 8/22 at [facility]. Noted to have had normal renal function 8/24. Was discharged to home 8/26. EMS called to home 04-26-2023 for dyspnea. Coded in ambulance. Approx 10 mins ACLS. Admitted to ICU with profound coagulopathy (was discharged to home on enoxaparin 120 mg BID), shock, atrial fibrillation, VRDF, AKI, post anoxic encephalopathy. She has a PMH of DVT, s/p IVC filter, recent gastric ulcer  MAJOR EVENTS/TEST RESULTS: 2023-04-26 Echocardiogram: EF 35% 8/29 CT head: Limited motion degraded study. Poor gray-white differentiation, possible cytotoxic edema from hypoxic ischemic injury. No intracranial hemorrhage or herniation 8/29 Echocardiogram: The LV cavity size was severely dilated. Systolic function was moderately reduced. The estimated ejection fraction was 35%. Akinesis of the anterior myocardium. Hypokinesis of the inferolateral myocardium. Akinesis of the apical myocardium  8/30 EEG: EEG with moderate slowing 8/30 repeat CT head: Motion degraded study. Note definite infarct, although the gray-white differentiation is difficult to evaluate in the cerebral hemispheres due to motion degradation 8/30 remains comatose. Off vasopressors. Renal function improving 8/30 Exploratory laparoscopy converted to laparotomy: repair of anastomotic leak, washout of abscesses. Back on vasopressors post op 8/31, 9/01 No significant neurological improvement. Off vasopressors 9/01 9/02 possible TF in JP drain. Concern for recurrence of anastomotic dehiscence. TFs stopped. TPN initiated. Surgery expressed desire to move to [facility] for definitive surgical intervention. Daughter expressed wish to wait on EEG results to decide on appropriateness of transfer 9/02 EEG:   INDWELLING DEVICES:: ETT 04/26/23 >>  R IJ CVL 2023/04/26 >> 8/30 R femoral A-line  04-26-23 >> 8/30 R IJ CVL 8/30 >>  L radiall A-line 8/30 >>   MICRO DATA: Blood 2023/04/26 >> Klebsiella Blood 8/31 >>    ANTIMICROBIALS:  Pip-tazo April 26, 2023 >> 8/30 Micafungin 8/30 >> 8/31 Meropenem 8/30 >>    SUBJ/INTERVAL: Remains minimally responsive off all sedation  VITAL SIGNS: Temp:  [99 F (37.2 C)-100 F (37.8 C)] 100 F (37.8 C) (09/02 1000) Pulse Rate:  [74-108] 102 (09/02 1000) Resp:  [16-23] 18 (09/02 1000) BP: (93-129)/(58-89) 119/84 mmHg (09/02 1000) SpO2:  [97 %-100 %] 99 % (09/02 1000) Arterial Line BP: (114-160)/(52-95) 124/65 mmHg (09/02 0800) FiO2 (%):  [30 %] 30 % (09/02 1201) Weight:  [171 kg (376 lb 15.8 oz)] 171 kg (376 lb 15.8 oz) (09/02 0645) HEMODYNAMICS: CVP:  [13 mmHg-15 mmHg] 13 mmHg VENTILATOR SETTINGS: Vent Mode:  [-] PRVC FiO2 (%):  [30 %] 30 % Set Rate:  [16 bmp] 16 bmp Vt Set:  [450 mL] 450 mL PEEP:  [5 cmH20] 5 cmH20 Plateau Pressure:  [16 cmH20-19 cmH20] 19 cmH20 INTAKE / OUTPUT:  Intake/Output Summary (Last 24 hours) at 04/18/15 1220 Last data filed at 04/18/15 1100  Gross per 24 hour  Intake    663 ml  Output   3420 ml  Net  -2757 ml    PHYSICAL EXAMINATION: General: Morbidly obese, comatose, intubated Neuro: PERRL, corneals absent, no spontaneous movement, no withdrawal from pain HEENT: WNL Cardiovascular: IRIR, no M noted Lungs: clear anteriorly Abdomen: Markedly obese, mildly firm, surgical wound dressed Ext: 1 - 2+ symmetric LE edema  LABS:  CBC  Recent Labs Lab 04/16/15 0547 04/17/15 0930 04/18/15 0503  WBC 22.2* 11.8* 9.5  HGB 9.5* 8.6* 8.7*  HCT 30.7* 27.3* 26.8*  PLT 69* 64* 84*   Coag's  Recent Labs Lab  04/13/15 1718 04/13/15 2344 04/14/15 0504 04/15/15 0446 04/16/15 0800  APTT 45* 42* 41*  --   --   INR 1.93 1.76 1.69 1.43 1.69   BMET  Recent Labs Lab 04/16/15 0547 04/17/15 0837 04/18/15 0503  NA 145 149*  148* 151*  K 5.3* 3.1*  3.1* 3.5  CL 116* 119*  118* 121*  CO2 BUN 59* 36*  36* 26*  CREATININE 1.55* 0.98  1.03* 0.75  GLUCOSE 103* 114*  114* 117*   Electrolytes  Recent Labs Lab 04/13/15 0625  04/16/15 0547 04/17/15 0837 04/18/15 0503  CALCIUM 7.9*  < > 7.3* 7.6*  7.5* 7.8*  MG 2.7*  --   --   --  1.9  PHOS  --   --   --  1.7* 1.4*  < > = values in this interval not displayed. Sepsis Markers  Recent Labs Lab 04/13/15 0642 04/13/15 1130 04/14/15 0504  LATICACIDVEN 6.3* 1.7 1.8  PROCALCITON  --   --  66.58   ABG  Recent Labs Lab 04/13/15 0639 04/13/15 1400 04/14/15 0517  PHART 7.29* 7.35 7.44  PCO2ART 37 34 30*  PO2ART 174* 209* 92   Liver Enzymes  Recent Labs Lab 04/14/15 0504 04/15/15 0446 04/16/15 0547 04/17/15 0837  AST 85* 48* 66*  --   ALT 62* 49 52  --   ALKPHOS 53 62 71  --   BILITOT 3.2* 2.0* 3.9*  --   ALBUMIN 2.0* 1.8* 1.5* 1.5*   Cardiac Enzymes  Recent Labs Lab 04/13/15 1718 04/13/15 2157 04/14/15 0504  TROPONINI <0.03 <0.03 0.03   Glucose  Recent Labs Lab 04/17/15 0739 04/17/15 1135 04/17/15 1621 04/18/15 0003 04/18/15 0738 04/18/15 1135  GLUCAP 105* 97 102* 114* 101* 99    CXR: No infiltrates or edema   ASSESSMENT / PLAN:  CARDIOVASCULAR A: Out of hospital cardiac arrest 8/28 Septic shock, resolved New onset AF, rate controlled without intervention P:  CVP goal 10-14 mmHg MAP goal > 65 mmHg  PULMONARY A: VDRF post arrest Poor cognition prohibits extubation 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, s/p IVC filter P:   Cont full vent support - settings reviewed and/or adjusted Cont vent bundle Daily SBT if/when meets criteria  RENAL A:   AKI, resolved Hypokalemia, recurrent Mild hyperkalemia Hypernatremia P:   Monitor BMET intermittently Monitor I/Os Correct electrolytes as indicated Cont D5W until Na 140 - increased to 75 cc/hr 9/02  GASTROINTESTINAL A:   Post bariatric surgery 8/22 Morbid obesity Anastomotic dehiscence  Concern for recurrence  9/02 P:   SUP: IV PPI Trickle TFs initiated 9/01, stopped 9/02 Further mgmt per surgery TPN ordered 9/02  HEMATOLOGIC A:  Severe coagulopathy, resolved Acute blood loss anemia. No further bleeding evident Thrombocytopenia P:  DVT px: SCDs Monitor CBC intermittently Transfuse per usual guidelines  INFECTIOUS A:   Klebsiella bacteremia  Peritonitis with intra-abdominal abscesses P:   Monitor temp, WBC count Micro and abx as above  ENDOCRINE A:   Hyperglycemia without hx of DM - resolved P:   DC SSI 9/01. Resume for CBG > 180 Resume SSI 9/02 in anticipation of TPN  NEUROLOGIC A:   Acute encephalopathy - TME and/or post anoxic P:   RASS goal: 0, -1 Neurology following. Recommends support through [facility] F/U results of repeat EEG 9/02  Discussed with Dr Hilton Sinclair Daughter and son in law updated in detail.   CCM time 35 mins   Billy Fischer, MD PCCM service  Mobile [phone] Pager [phone]

## 2015-04-18 NOTE — Progress Notes (Signed)
   04/18/15 1053  Clinical Encounter Type  Visited With Patient;Patient and family together  Visit Type Follow-up;Spiritual support  Consult/Referral To Chaplain  Spiritual Encounters  Spiritual Needs Emotional  Stress Factors  Patient Stress Factors Health changes  Family Stress Factors Health changes  Chaplalin rounded in the unit and offered a compassionate presence and support to patient and family. Chaplain Augustino Savastano A. Carson Bogden Ext. [phone]

## 2015-04-18 NOTE — Progress Notes (Signed)
Dr. Juliann Pulse ordered RN to discontinue tube feedings and connect feeding tube to gravity drain and continue flush schedule that was in place before tube feedings started. RN also received call from Dr. Smitty Cords for update on patient.

## 2015-04-22 ENCOUNTER — Encounter: Payer: Self-pay | Admitting: Specialist

## 2015-06-17 DEATH — deceased
